# Patient Record
Sex: Female | Born: 1960 | Race: White | Hispanic: No | Marital: Married | Smoking: Never smoker
Health system: Southern US, Community
[De-identification: ages and names within clinical notes are randomized; demographics above are authoritative.]

## PROBLEM LIST (undated history)

## (undated) DIAGNOSIS — E559 Vitamin D deficiency, unspecified: Secondary | ICD-10-CM

## (undated) DIAGNOSIS — Z862 Personal history of diseases of the blood and blood-forming organs and certain disorders involving the immune mechanism: Secondary | ICD-10-CM

## (undated) DIAGNOSIS — K219 Gastro-esophageal reflux disease without esophagitis: Secondary | ICD-10-CM

## (undated) HISTORY — PX: APPENDECTOMY: SHX54

## (undated) HISTORY — DX: Personal history of diseases of the blood and blood-forming organs and certain disorders involving the immune mechanism: Z86.2

## (undated) HISTORY — DX: Gastro-esophageal reflux disease without esophagitis: K21.9

## (undated) HISTORY — DX: Vitamin D deficiency, unspecified: E55.9

---

## 2018-06-06 ENCOUNTER — Encounter: Payer: Self-pay | Admitting: Cardiology

## 2018-06-06 ENCOUNTER — Ambulatory Visit (INDEPENDENT_AMBULATORY_CARE_PROVIDER_SITE_OTHER): Payer: Commercial Managed Care - PPO | Admitting: Cardiology

## 2018-06-06 DIAGNOSIS — R002 Palpitations: Secondary | ICD-10-CM | POA: Insufficient documentation

## 2018-06-06 DIAGNOSIS — R0609 Other forms of dyspnea: Secondary | ICD-10-CM

## 2018-06-06 DIAGNOSIS — I480 Paroxysmal atrial fibrillation: Secondary | ICD-10-CM

## 2018-06-06 DIAGNOSIS — R0600 Dyspnea, unspecified: Secondary | ICD-10-CM

## 2018-06-06 HISTORY — DX: Other forms of dyspnea: R06.09

## 2018-06-06 HISTORY — DX: Palpitations: R00.2

## 2018-06-06 HISTORY — DX: Paroxysmal atrial fibrillation: I48.0

## 2018-06-06 HISTORY — DX: Dyspnea, unspecified: R06.00

## 2018-06-06 MED ORDER — METOPROLOL SUCCINATE ER 50 MG PO TB24: 50.0000 mg | ORAL_TABLET | Freq: Every day | ORAL | 1 refills | Status: DC

## 2018-06-06 NOTE — Patient Instructions (Signed)
Medication Instructions:  Your physician has recommended you make the following change in your medication:  Start: Metoprolol succinate 50 mg daily  If you need a refill on your cardiac medications before your next appointment, please call your pharmacy.   Lab work: None.  If you have labs (blood work) drawn today and your tests are completely normal, you will receive your results only by: Michelle Perkins. MyChart Message (if you have MyChart) OR . A paper copy in the mail If you have any lab test that is abnormal or we need to change your treatment, we will call you to review the results.  Testing/Procedures: Your physician has requested that you have an echocardiogram. Echocardiography is a painless test that uses sound waves to create images of your heart. It provides your doctor with information about the size and shape of your heart and how well your heart's chambers and valves are working. This procedure takes approximately one hour. There are no restrictions for this procedure.    Follow-Up: At [facility], you and your health needs are our priority.  As part of our continuing mission to provide you with exceptional heart care, we have created designated Provider Care Teams.  These Care Teams include your primary Cardiologist (physician) and Advanced Practice Providers (APPs -  Physician Assistants and Nurse Practitioners) who all work together to provide you with the care you need, when you need it. You will need a follow up appointment in 1 months.  Please call our office 2 months in advance to schedule this appointment.  You may see No primary care provider on file. or another member of our [facility] Provider Team in [place]: Norman HerrlichBrian Munley, MD . Belva Cromeajan Revankar, MD  Any Other Special Instructions Will Be Listed Below (If Applicable).  Metoprolol extended-release tablets What is this medicine? METOPROLOL (me TOE proe lole) is a beta-blocker. Beta-blockers reduce the workload on the heart  and help it to beat more regularly. This medicine is used to treat high blood pressure and to prevent chest pain. It is also used to after a heart attack and to prevent an additional heart attack from occurring. This medicine may be used for other purposes; ask your health care provider or pharmacist if you have questions. COMMON BRAND NAME(S): toprol, Toprol XL What should I tell my health care provider before I take this medicine? They need to know if you have any of these conditions: -diabetes -heart or vessel disease like slow heart rate, worsening heart failure, heart block, sick sinus syndrome or Raynaud's disease -kidney disease -liver disease -lung or breathing disease, like asthma or emphysema -pheochromocytoma -thyroid disease -an unusual or allergic reaction to metoprolol, other beta-blockers, medicines, foods, dyes, or preservatives -pregnant or trying to get pregnant -breast-feeding How should I use this medicine? Take this medicine by mouth with a glass of water. Follow the directions on the prescription label. Do not crush or chew. Take this medicine with or immediately after meals. Take your doses at regular intervals. Do not take more medicine than directed. Do not stop taking this medicine suddenly. This could lead to serious heart-related effects. Talk to your pediatrician regarding the use of this medicine in children. While this drug may be prescribed for children as young as 6 years for selected conditions, precautions do apply. Overdosage: If you think you have taken too much of this medicine contact a poison control center or emergency room at once. NOTE: This medicine is only for you. Do not share this medicine  with others. What if I miss a dose? If you miss a dose, take it as soon as you can. If it is almost time for your next dose, take only that dose. Do not take double or extra doses. What may interact with this medicine? This medicine may interact with the  following medications: -certain medicines for blood pressure, heart disease, irregular heart beat -certain medicines for depression, like monoamine oxidase (MAO) inhibitors, fluoxetine, or paroxetine -clonidine -dobutamine -epinephrine -isoproterenol -reserpine This list may not describe all possible interactions. Give your health care provider a list of all the medicines, herbs, non-prescription drugs, or dietary supplements you use. Also tell them if you smoke, drink alcohol, or use illegal drugs. Some items may interact with your medicine. What should I watch for while using this medicine? Visit your doctor or health care professional for regular check ups. Contact your doctor right away if your symptoms worsen. Check your blood pressure and pulse rate regularly. Ask your health care professional what your blood pressure and pulse rate should be, and when you should contact them. You may get drowsy or dizzy. Do not drive, use machinery, or do anything that needs mental alertness until you know how this medicine affects you. Do not sit or stand up quickly, especially if you are an older patient. This reduces the risk of dizzy or fainting spells. Contact your doctor if these symptoms continue. Alcohol may interfere with the effect of this medicine. Avoid alcoholic drinks. What side effects may I notice from receiving this medicine? Side effects that you should report to your doctor or health care professional as soon as possible: -allergic reactions like skin rash, itching or hives -cold or numb hands or feet -depression -difficulty breathing -faint -fever with sore throat -irregular heartbeat, chest pain -rapid weight gain -swollen legs or ankles Side effects that usually do not require medical attention (report to your doctor or health care professional if they continue or are bothersome): -anxiety or nervousness -change in sex drive or performance -dry skin -headache -nightmares or  trouble sleeping -short term memory loss -stomach upset or diarrhea -unusually tired This list may not describe all possible side effects. Call your doctor for medical advice about side effects. You may report side effects to FDA at 1-800-FDA-1088. Where should I keep my medicine? Keep out of the reach of children. Store at room temperature between 15 and 30 degrees C (59 and 86 degrees F). Throw away any unused medicine after the expiration date. NOTE: This sheet is a summary. It may not cover all possible information. If you have questions about this medicine, talk to your doctor, pharmacist, or health care provider.  2019 Elsevier/Gold Standard (2013-01-27 14:41:37)    Echocardiogram An echocardiogram is a procedure that uses painless sound waves (ultrasound) to produce an image of the heart. Images from an echocardiogram can provide important information about:  Signs of coronary artery disease (CAD).  Aneurysm detection. An aneurysm is a weak or damaged part of an artery wall that bulges out from the normal force of blood pumping through the body.  Heart size and shape. Changes in the size or shape of the heart can be associated with certain conditions, including heart failure, aneurysm, and CAD.  Heart muscle function.  Heart valve function.  Signs of a past heart attack.  Fluid buildup around the heart.  Thickening of the heart muscle.  A tumor or infectious growth around the heart valves. Tell a health care provider about:  Any allergies  you have.  All medicines you are taking, including vitamins, herbs, eye drops, creams, and over-the-counter medicines.  Any blood disorders you have.  Any surgeries you have had.  Any medical conditions you have.  Whether you are pregnant or may be pregnant. What are the risks? Generally, this is a safe procedure. However, problems may occur, including:  Allergic reaction to dye (contrast) that may be used during the  procedure. What happens before the procedure? No specific preparation is needed. You may eat and drink normally. What happens during the procedure?   An IV tube may be inserted into one of your veins.  You may receive contrast through this tube. A contrast is an injection that improves the quality of the pictures from your heart.  A gel will be applied to your chest.  A wand-like tool (transducer) will be moved over your chest. The gel will help to transmit the sound waves from the transducer.  The sound waves will harmlessly bounce off of your heart to allow the heart images to be captured in real-time motion. The images will be recorded on a computer. The procedure may vary among health care providers and hospitals. What happens after the procedure?  You may return to your normal, everyday life, including diet, activities, and medicines, unless your health care provider tells you not to do that. Summary  An echocardiogram is a procedure that uses painless sound waves (ultrasound) to produce an image of the heart.  Images from an echocardiogram can provide important information about the size and shape of your heart, heart muscle function, heart valve function, and fluid buildup around your heart.  You do not need to do anything to prepare before this procedure. You may eat and drink normally.  After the echocardiogram is completed, you may return to your normal, everyday life, unless your health care provider tells you not to do that. This information is not intended to replace advice given to you by your health care provider. Make sure you discuss any questions you have with your health care provider. Document Released: 05/22/2000 Document Revised: 06/27/2016 Document Reviewed: 06/27/2016 Elsevier Interactive Patient Education  2019 [employer]

## 2018-06-06 NOTE — Progress Notes (Signed)
Cardiology Consultation:    Date:  06/06/2018   ID:  Michelle Perkins, DOB 1960/12/14, MRN [id number]  PCP:  Philemon KingdomProchnau, Caroline, MD  Cardiologist:  Gypsy Balsamobert Kalisi Bevill, MD   Referring MD: No ref. provider found   Chief Complaint  Patient presents with  . ER follow up  I had atrial fibrillation  History of Present Illness:    Michelle Perkins is a 57 y.o. female who is being seen today for the evaluation of atrial fibrillation at the request of No ref. provider found.  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  Since that time she is been doing okay however last night she said she woke up in the middle of the night with forceful beating of her heart denies having any irregularity she also checked her heart rate and was noted to fast.  Overall she never had any heart trouble but does have multiple family members with history of paroxysmal atrial fibrillation.  Denies have any chest pain tightness squeezing pressure burning chest.  I see her working in PCU and she is quite energetic.  No past medical history on file.    Current Medications: No outpatient medications have been marked as taking for the 06/06/18 encounter (Office Visit) with Georgeanna LeaKrasowski, Indy Kuck J, MD.     Allergies:   Codeine; Levaquin [levofloxacin]; Lidocaine; and Penicillins   Social History   Socioeconomic History  . Marital status: Married    Spouse name: Not on file  . Number of children: Not on file  . Years of education: Not on file  . Highest education level: Not on file    Occupational History  . Not on file  Social Needs  . Financial resource strain: Not on file  . Food insecurity:    Worry: Not on file    Inability: Not on file  . Transportation needs:    Medical: Not on file    Non-medical: Not on file  Tobacco Use  . Smoking status: Never Smoker  . Smokeless tobacco: Never Used  Substance and Sexual Activity  . Alcohol use: Never    Frequency: Never  . Drug use: Never  . Sexual activity: Not on file  Lifestyle  . Physical activity:    Days per week: Not on file    Minutes per session: Not on file  . Stress: Not on file  Relationships  . Social connections:    Talks on phone: Not on file    Gets together: Not on file    Attends religious service: Not on file    Active member of club or organization: Not on file    Attends meetings of clubs or organizations: Not on file    Relationship status: Not on file  Other Topics Concern  . Not on file  Social History Narrative  . Not on file     Family History: The patient's family history includes Atrial fibrillation in her brother, father, mother, and sister; Heart failure in her father; Lung cancer in her mother. ROS:   Please see the history of  present illness.    All 14 point review of systems negative except as described per history of present illness.  EKGs/Labs/Other Studies Reviewed:    The following studies were reviewed today: Atrial fibrillation with a rate of 152.  ST segment depression inferior lateral leads  EKG:  EKG is  ordered today.  The ekg ordered today demonstrates NSR, non specific ST changes  Recent Labs: No results found for requested labs within last 8760 hours.  Recent Lipid Panel No results found for: CHOL, TRIG, HDL, CHOLHDL, VLDL, LDLCALC, LDLDIRECT  Physical Exam:    VS:  BP 140/62   Pulse (!) 102   Ht 5\' 8"  (1.727 m)   Wt 222 lb (100.7 kg)   SpO2 98%   BMI 33.75 kg/m     Wt Readings from Last 3 Encounters:  06/06/18 222 lb (100.7 kg)      GEN:  Well nourished, well developed in no acute distress HEENT: Normal NECK: No JVD; No carotid bruits LYMPHATICS: No lymphadenopathy CARDIAC: RRR, no murmurs, no rubs, no gallops RESPIRATORY:  Clear to auscultation without rales, wheezing or rhonchi  ABDOMEN: Soft, non-tender, non-distended MUSCULOSKELETAL:  No edema; No deformity  SKIN: Warm and dry NEUROLOGIC:  Alert and oriented x 3 PSYCHIATRIC:  Normal affect   ASSESSMENT:    1. Paroxysmal atrial fibrillation (HCC)   2. Dyspnea on exertion   3. Palpitations    PLAN:    In order of problems listed above:  1. Paroxysmal atrial fibrillation.  Chads 2 vascular equals 1.  She is not anticoagulated however she feels her heart forcefully beating today sounds are regular we will do EKG to confirm the rhythm if she is in normal rhythm we will not initiate anticoagulation because of chads 2 vascular equals 1.  I will ask him to have echocardiogram done to assess left ventricular ejection fraction and more importantly left atrial size, I will give her a small dose of beta-blocker try to see if I can suppress her symptoms.  I will also schedule her to have a stress test in spite of the fact that she does not have any symptoms she does have quite significant ST segment changes on EKG and that need to be clarified. 2. Dyspnea on exertion while in atrial fibrillation.  Echocardiogram will be done stress test will be done as well. 3. Palpitations related to atrial fibrillation.  However today she does feel palpitations and her heart rate appears to be regular   Medication Adjustments/Labs and Tests Ordered: Current medicines are reviewed at length with the patient today.  Concerns regarding medicines are outlined above.  No orders of the defined types were placed in this encounter.  No orders of the defined types were placed in this encounter.   Signed, Georgeanna Leaobert J. Lynell Greenhouse, MD, [place] [place]. 06/06/2018 9:33 AM    [facility]

## 2018-06-10 ENCOUNTER — Encounter: Payer: Self-pay | Admitting: Cardiology

## 2018-06-10 DIAGNOSIS — R0609 Other forms of dyspnea: Secondary | ICD-10-CM | POA: Diagnosis not present

## 2018-08-15 ENCOUNTER — Encounter: Payer: Self-pay | Admitting: Cardiology

## 2018-08-15 ENCOUNTER — Ambulatory Visit (INDEPENDENT_AMBULATORY_CARE_PROVIDER_SITE_OTHER): Payer: Commercial Managed Care - PPO | Admitting: Cardiology

## 2018-08-15 VITALS — BP 114/66 | HR 80 | Ht 68.0 in | Wt 224.4 lb

## 2018-08-15 DIAGNOSIS — I480 Paroxysmal atrial fibrillation: Secondary | ICD-10-CM

## 2018-08-15 DIAGNOSIS — R002 Palpitations: Secondary | ICD-10-CM | POA: Diagnosis not present

## 2018-08-15 DIAGNOSIS — R0609 Other forms of dyspnea: Secondary | ICD-10-CM

## 2018-08-15 MED ORDER — APIXABAN 5 MG PO TABS: 5.0000 mg | ORAL_TABLET | ORAL | 0 refills | Status: DC | PRN

## 2018-08-15 MED ORDER — METOPROLOL SUCCINATE ER 25 MG PO TB24: 75.0000 mg | ORAL_TABLET | Freq: Every day | ORAL | 1 refills | Status: DC

## 2018-08-15 MED ORDER — APIXABAN 5 MG PO TABS: 5.0000 mg | ORAL_TABLET | Freq: Two times a day (BID) | ORAL | 0 refills | Status: DC

## 2018-08-15 NOTE — Patient Instructions (Addendum)
Medication Instructions:  Your physician has recommended you make the following change in your medication:  Take as needed for atrial fibrillation lasting longer than 24 hours and contact our office: eliquis 5 mg twice daily   Increase: Metoprolol succinate 75 mg daily   If you need a refill on your cardiac medications before your next appointment, please call your pharmacy.   Lab work: None.  If you have labs (blood work) drawn today and your tests are completely normal, you will receive your results only by: Marland Kitchen MyChart Message (if you have MyChart) OR . A paper copy in the mail If you have any lab test that is abnormal or we need to change your treatment, we will call you to review the results.  Testing/Procedures: None.   Follow-Up: At [facility], you and your health needs are our priority.  As part of our continuing mission to provide you with exceptional heart care, we have created designated Provider Care Teams.  These Care Teams include your primary Cardiologist (physician) and Advanced Practice Providers (APPs -  Physician Assistants and Nurse Practitioners) who all work together to provide you with the care you need, when you need it. You will need a follow up appointment in 3 months.  Please call our office 2 months in advance to schedule this appointment.  You may see No primary care provider on file. or another member of our [facility] Provider Team in [place]: Norman Herrlich, MD . Belva Crome, MD  Any Other Special Instructions Will Be Listed Below (If Applicable).  Apixaban oral tablets What is this medicine? APIXABAN (a PIX a ban) is an anticoagulant (blood thinner). It is used to lower the chance of stroke in people with a medical condition called atrial fibrillation. It is also used to treat or prevent blood clots in the lungs or in the veins. This medicine may be used for other purposes; ask your health care provider or pharmacist if you have  questions. COMMON BRAND NAME(S): Eliquis What should I tell my health care provider before I take this medicine? They need to know if you have any of these conditions: -bleeding disorders -bleeding in the brain -blood in your stools (black or tarry stools) or if you have blood in your vomit -history of stomach bleeding -kidney disease -liver disease -mechanical heart valve -an unusual or allergic reaction to apixaban, other medicines, foods, dyes, or preservatives -pregnant or trying to get pregnant -breast-feeding How should I use this medicine? Take this medicine by mouth with a glass of water. Follow the directions on the prescription label. You can take it with or without food. If it upsets your stomach, take it with food. Take your medicine at regular intervals. Do not take it more often than directed. Do not stop taking except on your doctor's advice. Stopping this medicine may increase your risk of a blood clot. Be sure to refill your prescription before you run out of medicine. Talk to your pediatrician regarding the use of this medicine in children. Special care may be needed. Overdosage: If you think you have taken too much of this medicine contact a poison control center or emergency room at once. NOTE: This medicine is only for you. Do not share this medicine with others. What if I miss a dose? If you miss a dose, take it as soon as you can. If it is almost time for your next dose, take only that dose. Do not take double or extra doses. What may interact  with this medicine? This medicine may interact with the following: -aspirin and aspirin-like medicines -certain medicines for fungal infections like ketoconazole and itraconazole -certain medicines for seizures like carbamazepine and phenytoin -certain medicines that treat or prevent blood clots like warfarin, enoxaparin, and dalteparin -clarithromycin -NSAIDs, medicines for pain and inflammation, like ibuprofen or  naproxen -rifampin -ritonavir -St. John's wort This list may not describe all possible interactions. Give your health care provider a list of all the medicines, herbs, non-prescription drugs, or dietary supplements you use. Also tell them if you smoke, drink alcohol, or use illegal drugs. Some items may interact with your medicine. What should I watch for while using this medicine? Visit your healthcare professional for regular checks on your progress. You may need blood work done while you are taking this medicine. Your condition will be monitored carefully while you are receiving this medicine. It is important not to miss any appointments. Avoid sports and activities that might cause injury while you are using this medicine. Severe falls or injuries can cause unseen bleeding. Be careful when using sharp tools or knives. Consider using an Neurosurgeon. Take special care brushing or flossing your teeth. Report any injuries, bruising, or red spots on the skin to your healthcare professional. If you are going to need surgery or other procedure, tell your healthcare professional that you are taking this medicine. Wear a medical ID bracelet or chain. Carry a card that describes your disease and details of your medicine and dosage times. What side effects may I notice from receiving this medicine? Side effects that you should report to your doctor or health care professional as soon as possible: -allergic reactions like skin rash, itching or hives, swelling of the face, lips, or tongue -signs and symptoms of bleeding such as bloody or black, tarry stools; red or dark-brown urine; spitting up blood or brown material that looks like coffee grounds; red spots on the skin; unusual bruising or bleeding from the eye, gums, or nose -signs and symptoms of a blood clot such as chest pain; shortness of breath; pain, swelling, or warmth in the leg -signs and symptoms of a stroke such as changes in vision;  confusion; trouble speaking or understanding; severe headaches; sudden numbness or weakness of the face, arm or leg; trouble walking; dizziness; loss of coordination This list may not describe all possible side effects. Call your doctor for medical advice about side effects. You may report side effects to FDA at 1-800-FDA-1088. Where should I keep my medicine? Keep out of the reach of children. Store at room temperature between 20 and 25 degrees C (68 and 77 degrees F). Throw away any unused medicine after the expiration date. NOTE: This sheet is a summary. It may not cover all possible information. If you have questions about this medicine, talk to your doctor, pharmacist, or health care provider.  2019 Elsevier/Gold Standard (2017-05-20 11:20:07)

## 2018-08-15 NOTE — Progress Notes (Signed)
Cardiology Office Note:    Date:  08/15/2018   ID:  Michelle Perkins, DOB 1960-10-29, MRN [id number]  PCP:  Philemon Kingdom, MD  Cardiologist:  Gypsy Balsam, MD    Referring MD: Philemon Kingdom, MD   Chief Complaint  Patient presents with  . Atrial Fibrillation  I had episode of atrial fibrillation over the weekend  History of Present Illness:    Michelle Perkins is a 58 y.o. female with paroxysmal atrial fibrillation so far she got one documented episode of atrial fibrillation which happened in December she called me over the weekend and described episode of palpitations again she took extra dose of Cardizem and eventually converted to sinus rhythm since that time she is doing well she came today to talk about that overall she is active and she does a lot of things have no difficulty no limitations her thyroid was normal previously she does not have sleep apnea she does not drink alcohol.  She does have multiple family members with episode of atrial fibrillation we talked about options in this situation I advised to increase dose of Toprol-XL she takes 50 mg to 75 mg she will use Cardizem short acting on as needed basis for paroxysmal atrial fibrillation she is not anticoagulated her chads 2 Vascor equals 1.  I will however give her samples of Eliquis 5 mg twice daily with instruction to take it only if episode of atrial fibrillation last more than 24 hours I did review her echocardiogram from hospital which showed normal left atrial size and normal left ventricular ejection fraction.  We talked about the need to exercise weight management which can helps with preventing episode of atrial fibrillation.  Also told her if she will have more recurrences of atrial fibrillation then antiarrhythmic therapy need to be started we also briefly discussed the issue of atrial fibrillation ablation.  No past medical history on file.    Current Medications: Current Meds  Medication Sig  . diltiazem  (CARDIZEM) 30 MG tablet Take 30 mg by mouth 3 (three) times daily. If heart rate is over 120  . metoprolol succinate (TOPROL-XL) 50 MG 24 hr tablet Take 1 tablet (50 mg total) by mouth daily. Take with or immediately following a meal.     Allergies:   Codeine; Levaquin [levofloxacin]; Lidocaine; and Penicillins   Social History   Socioeconomic History  . Marital status: Married    Spouse name: Not on file  . Number of children: Not on file  . Years of education: Not on file  . Highest education level: Not on file  Occupational History  . Not on file  Social Needs  . Financial resource strain: Not on file  . Food insecurity:    Worry: Not on file    Inability: Not on file  . Transportation needs:    Medical: Not on file    Non-medical: Not on file  Tobacco Use  . Smoking status: Never Smoker  . Smokeless tobacco: Never Used  Substance and Sexual Activity  . Alcohol use: Never    Frequency: Never  . Drug use: Never  . Sexual activity: Not on file  Lifestyle  . Physical activity:    Days per week: Not on file    Minutes per session: Not on file  . Stress: Not on file  Relationships  . Social connections:    Talks on phone: Not on file    Gets together: Not on file    Attends religious service:  Not on file    Active member of club or organization: Not on file    Attends meetings of clubs or organizations: Not on file    Relationship status: Not on file  Other Topics Concern  . Not on file  Social History Narrative  . Not on file     Family History: The patient's family history includes Atrial fibrillation in her brother, father, mother, and sister; Heart failure in her father; Lung cancer in her mother. ROS:   Please see the history of present illness.    All 14 point review of systems negative except as described per history of present illness  EKGs/Labs/Other Studies Reviewed:    Normal sinus rhythm normal P interval normal QS complex duration morphology no  ST-T segment changes  Recent Labs: No results found for requested labs within last 8760 hours.  Recent Lipid Panel No results found for: CHOL, TRIG, HDL, CHOLHDL, VLDL, LDLCALC, LDLDIRECT  Physical Exam:    VS:  BP 114/66   Pulse 80   Ht 5\' 8"  (1.727 m)   Wt 224 lb 6.4 oz (101.8 kg)   SpO2 96%   BMI 34.12 kg/m     Wt Readings from Last 3 Encounters:  08/15/18 224 lb 6.4 oz (101.8 kg)  06/06/18 222 lb (100.7 kg)     GEN:  Well nourished, well developed in no acute distress HEENT: Normal NECK: No JVD; No carotid bruits LYMPHATICS: No lymphadenopathy CARDIAC: RRR, no murmurs, no rubs, no gallops RESPIRATORY:  Clear to auscultation without rales, wheezing or rhonchi  ABDOMEN: Soft, non-tender, non-distended MUSCULOSKELETAL:  No edema; No deformity  SKIN: Warm and dry LOWER EXTREMITIES: no swelling NEUROLOGIC:  Alert and oriented x 3 PSYCHIATRIC:  Normal affect   ASSESSMENT:    1. Paroxysmal atrial fibrillation (HCC)   2. Palpitations   3. Dyspnea on exertion    PLAN:    In order of problems listed above:  1. Paroxysmal atrial fibrillation will increase dose of Toprol-XL I will give her samples of Eliquis with instruction to take it only for have episode of atrial fibrillation lasting more than 24 hours 2. Palpitations look like she have recurrences of atrial fibrillation, I advised her to get apple watch and record arrhythmia if she got palpitations. 3. Is been on exertion weight management has been discussed.  Echocardiogram showed preserved ejection fraction   Medication Adjustments/Labs and Tests Ordered: Current medicines are reviewed at length with the patient today.  Concerns regarding medicines are outlined above.  No orders of the defined types were placed in this encounter.  Medication changes: No orders of the defined types were placed in this encounter.   Signed, [name], MD, [facility] 08/15/2018 11:00 AM    [facility]

## 2018-11-15 ENCOUNTER — Telehealth: Payer: Commercial Managed Care - PPO | Admitting: Cardiology

## 2018-12-13 ENCOUNTER — Telehealth: Payer: Self-pay | Admitting: *Deleted

## 2018-12-13 NOTE — Telephone Encounter (Signed)
Pt is experiencing dizziness and a-fib. Has questions about medication rxd Diltiazem. Pt wanted to see Dr. Agustin Cree but he is booked out to September. Please advise.

## 2018-12-13 NOTE — Telephone Encounter (Signed)
Having AF issues with rates in the 70-80's. Feeling bad . Wants to see Dr. Agustin Cree. Appointment made for 12/14/2018 at 4:15PM.

## 2018-12-14 ENCOUNTER — Other Ambulatory Visit: Payer: Self-pay

## 2018-12-14 ENCOUNTER — Telehealth (INDEPENDENT_AMBULATORY_CARE_PROVIDER_SITE_OTHER): Payer: Commercial Managed Care - PPO | Admitting: Cardiology

## 2018-12-14 DIAGNOSIS — I480 Paroxysmal atrial fibrillation: Secondary | ICD-10-CM

## 2018-12-14 DIAGNOSIS — R0609 Other forms of dyspnea: Secondary | ICD-10-CM

## 2018-12-14 DIAGNOSIS — R002 Palpitations: Secondary | ICD-10-CM | POA: Diagnosis not present

## 2018-12-14 NOTE — Progress Notes (Signed)
Cardiology Office Note:    Date:  12/14/2018   ID:  Michelle Perkins, DOB January 30, 1961, MRN [id number]  PCP:  Ernestene Kiel, MD  Cardiologist:  Jenne Campus, MD    Referring MD: Ernestene Kiel, MD   No chief complaint on file. I have palpitations  History of Present Illness:    Michelle Perkins is a 58 y.o. female with paroxysmal atrial fibrillation.  Recently she started having more palpitations and those appears to be lasting longer.  She is taking baseline beta-blocker and the use of Cardizem on as-needed basis she try to do it with limited success.  Within last 3 days she had at least 2 episodes of what appears to be atrial fibrillation.  I think the key will be trying to determine if it is truly atrial fibrillation.  Therefore I talked to her again about getting device that will allow her to record EKG herself.  We will also send to her 0 patch so we can see how much of atrial fibrillation she has asked her to increase dose of Toprol-XL 100 mg daily.  I also told her if she have another episodes to start using Eliquis.  I am afraid we reached the point that antiarrhythmic should be considered but we need to clarify exactly diagnosis.  No past medical history on file.    Current Medications: No outpatient medications have been marked as taking for the 12/14/18 encounter (Telemedicine) with Park Liter, MD.     Allergies:   Codeine, Levaquin [levofloxacin], Lidocaine, and Penicillins   Social History   Socioeconomic History  . Marital status: Married    Spouse name: Not on file  . Number of children: Not on file  . Years of education: Not on file  . Highest education level: Not on file  Occupational History  . Not on file  Social Needs  . Financial resource strain: Not on file  . Food insecurity    Worry: Not on file    Inability: Not on file  . Transportation needs    Medical: Not on file    Non-medical: Not on file  Tobacco Use  . Smoking status: Never  Smoker  . Smokeless tobacco: Never Used  Substance and Sexual Activity  . Alcohol use: Never    Frequency: Never  . Drug use: Never  . Sexual activity: Not on file  Lifestyle  . Physical activity    Days per week: Not on file    Minutes per session: Not on file  . Stress: Not on file  Relationships  . Social Herbalist on phone: Not on file    Gets together: Not on file    Attends religious service: Not on file    Active member of club or organization: Not on file    Attends meetings of clubs or organizations: Not on file    Relationship status: Not on file  Other Topics Concern  . Not on file  Social History Narrative  . Not on file     Family History: The patient's family history includes Atrial fibrillation in her brother, father, mother, and sister; Heart failure in her father; Lung cancer in her mother. ROS:   Please see the history of present illness.    All 14 point review of systems negative except as described per history of present illness  EKGs/Labs/Other Studies Reviewed:      Recent Labs: No results found for requested labs within last 8760 hours.  Recent Lipid Panel No results found for: CHOL, TRIG, HDL, CHOLHDL, VLDL, LDLCALC, LDLDIRECT  Physical Exam:    VS:  There were no vitals taken for this visit.    Wt Readings from Last 3 Encounters:  08/15/18 224 lb 6.4 oz (101.8 kg)  06/06/18 222 lb (100.7 kg)     GEN:  Well nourished, well developed in no acute distress HEENT: Normal NECK: No JVD; No carotid bruits LYMPHATICS: No lymphadenopathy CARDIAC: RRR, no murmurs, no rubs, no gallops RESPIRATORY:  Clear to auscultation without rales, wheezing or rhonchi  ABDOMEN: Soft, non-tender, non-distended MUSCULOSKELETAL:  No edema; No deformity  SKIN: Warm and dry LOWER EXTREMITIES: no swelling NEUROLOGIC:  Alert and oriented x 3 PSYCHIATRIC:  Normal affect   ASSESSMENT:    1. Paroxysmal atrial fibrillation (HCC)   2. Palpitations   3.  Dyspnea on exertion    PLAN:    In order of problems listed above:  1. Paroxysmal atrial fibrillation plan as outlined above 2. Palpitations we will put monitor to make sure that this is atrial fibrillation 3. Dyspnea and exertion echocardiogram reviewed which was normal   Medication Adjustments/Labs and Tests Ordered: Current medicines are reviewed at length with the patient today.  Concerns regarding medicines are outlined above.  No orders of the defined types were placed in this encounter.  Medication changes: No orders of the defined types were placed in this encounter.   Signed, Georgeanna Leaobert J. Daylin Gruszka, MD, [facility] 12/14/2018 4:45 PM    [facility]

## 2018-12-14 NOTE — Patient Instructions (Signed)
Medication Instructions:  Your physician recommends that you continue on your current medications as directed. Please refer to the Current Medication list given to you today.  If you need a refill on your cardiac medications before your next appointment, please call your pharmacy.   Lab work:None If you have labs (blood work) drawn today and your tests are completely normal, you will receive your results only by: Marland Kitchen MyChart Message (if you have MyChart) OR . A paper copy in the mail If you have any lab test that is abnormal or we need to change your treatment, we will call you to review the results.  Testing/Procedures: Your physician has recommended that you wear a ZIO monitor. ZIO monitors are medical devices that record the heart's electrical activity. Doctors most often use these monitors to diagnose arrhythmias. Arrhythmias are problems with the speed or rhythm of the heartbeat. The monitor is a small, portable device. You can wear one while you do your normal daily activities. This is usually used to diagnose what is causing palpitations/syncope (passing out).  Wear 7 days  Follow-Up: At [facility], you and your health needs are our priority.  As part of our continuing mission to provide you with exceptional heart care, we have created designated Provider Care Teams.  These Care Teams include your primary Cardiologist (physician) and Advanced Practice Providers (APPs -  Physician Assistants and Nurse Practitioners) who all work together to provide you with the care you need, when you need it. You will need a follow up appointment in 2 weeks.   Any Other Special Instructions Will Be Listed Below (If Applicable).

## 2018-12-20 ENCOUNTER — Other Ambulatory Visit (INDEPENDENT_AMBULATORY_CARE_PROVIDER_SITE_OTHER): Payer: Commercial Managed Care - PPO

## 2018-12-20 DIAGNOSIS — R002 Palpitations: Secondary | ICD-10-CM | POA: Diagnosis not present

## 2018-12-20 DIAGNOSIS — I480 Paroxysmal atrial fibrillation: Secondary | ICD-10-CM

## 2018-12-27 ENCOUNTER — Encounter: Payer: Self-pay | Admitting: Cardiology

## 2018-12-27 ENCOUNTER — Other Ambulatory Visit: Payer: Self-pay

## 2018-12-27 ENCOUNTER — Ambulatory Visit (INDEPENDENT_AMBULATORY_CARE_PROVIDER_SITE_OTHER): Payer: Commercial Managed Care - PPO | Admitting: Cardiology

## 2018-12-27 VITALS — BP 120/74 | HR 76 | Ht 68.0 in | Wt 231.6 lb

## 2018-12-27 DIAGNOSIS — R002 Palpitations: Secondary | ICD-10-CM

## 2018-12-27 DIAGNOSIS — I480 Paroxysmal atrial fibrillation: Secondary | ICD-10-CM | POA: Diagnosis not present

## 2018-12-27 DIAGNOSIS — R0609 Other forms of dyspnea: Secondary | ICD-10-CM

## 2018-12-27 MED ORDER — METOPROLOL SUCCINATE ER 100 MG PO TB24: 100.0000 mg | ORAL_TABLET | Freq: Every day | ORAL | 1 refills | Status: DC

## 2018-12-27 NOTE — Progress Notes (Signed)
Cardiology Office Note:    Date:  12/27/2018   ID:  Michelle DistanceLois B Lites, DOB 05/08/1961, MRN [id number]  PCP:  Philemon KingdomProchnau, Caroline, MD  Cardiologist:  Gypsy Balsamobert Krasowski, MD    Referring MD: Philemon KingdomProchnau, Caroline, MD   Chief Complaint  Patient presents with  . 2 Week Follow-up  Doing better  History of Present Illness:    Michelle Perkins is a 58 y.o. female with paroxysmal atrial fibrillation 2 weeks ago I saw her after she was complaining of having more palpitations.  To increase dose of beta-blocker.  She also wore monitor today she comes to talk about it overall she is doing better with higher dose of metoprolol.  Described to have some forceful pounding in her chest when she lay down at night.  She purchase cardia and she recorded few recordings which showing sinus rhythm.  Overall she is feeling better.  No past medical history on file.    Current Medications: Current Meds  Medication Sig  . apixaban (ELIQUIS) 5 MG TABS tablet Take 1 tablet (5 mg total) by mouth as needed (as needed for atrial firillation longer than 24 hours. 5 mg twice daily).  Marland Kitchen. diltiazem (CARDIZEM) 30 MG tablet Take 30 mg by mouth 3 (three) times daily. If heart rate is over 120  . metoprolol succinate (TOPROL-XL) 25 MG 24 hr tablet Take 3 tablets (75 mg total) by mouth daily. Take with or immediately following a meal. (Patient taking differently: Take 100 mg by mouth daily. Take with or immediately following a meal.)     Allergies:   Codeine, Levaquin [levofloxacin], Lidocaine, and Penicillins   Social History   Socioeconomic History  . Marital status: Married    Spouse name: Not on file  . Number of children: Not on file  . Years of education: Not on file  . Highest education level: Not on file  Occupational History  . Not on file  Social Needs  . Financial resource strain: Not on file  . Food insecurity    Worry: Not on file    Inability: Not on file  . Transportation needs    Medical: Not on file   Non-medical: Not on file  Tobacco Use  . Smoking status: Never Smoker  . Smokeless tobacco: Never Used  Substance and Sexual Activity  . Alcohol use: Never    Frequency: Never  . Drug use: Never  . Sexual activity: Not on file  Lifestyle  . Physical activity    Days per week: Not on file    Minutes per session: Not on file  . Stress: Not on file  Relationships  . Social Musicianconnections    Talks on phone: Not on file    Gets together: Not on file    Attends religious service: Not on file    Active member of club or organization: Not on file    Attends meetings of clubs or organizations: Not on file    Relationship status: Not on file  Other Topics Concern  . Not on file  Social History Narrative  . Not on file     Family History: The patient's family history includes Atrial fibrillation in her brother, father, mother, and sister; Heart failure in her father; Lung cancer in her mother. ROS:   Please see the history of present illness.    All 14 point review of systems negative except as described per history of present illness  EKGs/Labs/Other Studies Reviewed:      Recent Labs:  No results found for requested labs within last 8760 hours.  Recent Lipid Panel No results found for: CHOL, TRIG, HDL, CHOLHDL, VLDL, LDLCALC, LDLDIRECT  Physical Exam:    VS:  BP 120/74   Pulse 76   Ht 5\' 8"  (1.727 m)   Wt 231 lb 9.6 oz (105.1 kg)   SpO2 98%   BMI 35.21 kg/m     Wt Readings from Last 3 Encounters:  12/27/18 231 lb 9.6 oz (105.1 kg)  08/15/18 224 lb 6.4 oz (101.8 kg)  06/06/18 222 lb (100.7 kg)     GEN:  Well nourished, well developed in no acute distress HEENT: Normal NECK: No JVD; No carotid bruits LYMPHATICS: No lymphadenopathy CARDIAC: RRR, no murmurs, no rubs, no gallops RESPIRATORY:  Clear to auscultation without rales, wheezing or rhonchi  ABDOMEN: Soft, non-tender, non-distended MUSCULOSKELETAL:  No edema; No deformity  SKIN: Warm and dry LOWER  EXTREMITIES: no swelling NEUROLOGIC:  Alert and oriented x 3 PSYCHIATRIC:  Normal affect   ASSESSMENT:    1. Paroxysmal atrial fibrillation (HCC)   2. Palpitations   3. Dyspnea on exertion    PLAN:    In order of problems listed above:  1. Paroxysmal atrial fibrillation, chads 2 Vascor equals 1.  Not anticoagulated.  On calcium channel blocker as well as beta-blocker.  She did wear monitor awaiting results of it.  She is getting ready to send it back to [place] today. 2. Palpitations improved significantly continue higher dose of beta-blocker 3. Dyspnea on exertion echocardiogram was normal encouraged her to exercise on a regular basis also told her from atrial fibrillation point review weight loss and exercises on the regular basis can be beneficial.  And encouraged her to exercise   Medication Adjustments/Labs and Tests Ordered: Current medicines are reviewed at length with the patient today.  Concerns regarding medicines are outlined above.  No orders of the defined types were placed in this encounter.  Medication changes: No orders of the defined types were placed in this encounter.   Signed, Park Liter, MD, [facility] 12/27/2018 11:06 AM    [place]

## 2018-12-27 NOTE — Addendum Note (Signed)
Addended by: Ashok Norris on: 12/27/2018 11:14 AM   Modules accepted: Orders

## 2018-12-27 NOTE — Patient Instructions (Signed)
Medication Instructions:  Your physician has recommended you make the following change in your medication:   Increase: Metoprolol succinate to 100 mg daily   If you need a refill on your cardiac medications before your next appointment, please call your pharmacy.   Lab work: None.  If you have labs (blood work) drawn today and your tests are completely normal, you will receive your results only by: Marland Kitchen MyChart Message (if you have MyChart) OR . A paper copy in the mail If you have any lab test that is abnormal or we need to change your treatment, we will call you to review the results.  Testing/Procedures: None.   Follow-Up: At [facility], you and your health needs are our priority.  As part of our continuing mission to provide you with exceptional heart care, we have created designated Provider Care Teams.  These Care Teams include your primary Cardiologist (physician) and Advanced Practice Providers (APPs -  Physician Assistants and Nurse Practitioners) who all work together to provide you with the care you need, when you need it. You will need a follow up appointment in 3 months.  Please call our office 2 months in advance to schedule this appointment.  You may see No primary care provider on file. or another member of our [employer] Provider Team in [place]: Shirlee More, MD . Jyl Heinz, MD  Any Other Special Instructions Will Be Listed Below (If Applicable).

## 2019-03-02 ENCOUNTER — Other Ambulatory Visit: Payer: Self-pay | Admitting: Cardiology

## 2019-03-02 MED ORDER — METOPROLOL SUCCINATE ER 25 MG PO TB24: 75.0000 mg | ORAL_TABLET | Freq: Every day | ORAL | 0 refills | Status: DC

## 2019-03-02 NOTE — Telephone Encounter (Signed)
°*  STAT* If patient is at the pharmacy, call can be transferred to refill team.   1. Which medications need to be refilled? (please list name of each medication and dose if known) Metoprolol it has been changed to 75 mg exteneded release   2. Which pharmacy/location (including street and city if local pharmacy) is medication to be sent to? [facility]  3. Do they need a 30 day or 90 day supply? [place]

## 2019-03-02 NOTE — Addendum Note (Signed)
Addended by: Stevan Born on: 03/02/2019 04:47 PM   Modules accepted: Orders

## 2019-03-02 NOTE — Telephone Encounter (Signed)
Spoke with patient to verify which dose of metoprolol succinate she is currently taking.  Patient is a Marine scientist at [employer] and states that she spoke with Dr Agustin Cree in the hallway, and he reduced metoprolol succinate to 75mg  daily.  Per verbal order from Dr Agustin Cree, [place] to refill metoprolol succinate 25mg  three tablets daily. Rx sent to pharmacy.

## 2019-04-07 ENCOUNTER — Encounter: Payer: Self-pay | Admitting: Cardiology

## 2019-04-07 ENCOUNTER — Telehealth (INDEPENDENT_AMBULATORY_CARE_PROVIDER_SITE_OTHER): Payer: Commercial Managed Care - PPO | Admitting: Cardiology

## 2019-04-07 ENCOUNTER — Other Ambulatory Visit: Payer: Self-pay

## 2019-04-07 VITALS — HR 98

## 2019-04-07 DIAGNOSIS — I480 Paroxysmal atrial fibrillation: Secondary | ICD-10-CM

## 2019-04-07 DIAGNOSIS — R002 Palpitations: Secondary | ICD-10-CM

## 2019-04-07 DIAGNOSIS — R0609 Other forms of dyspnea: Secondary | ICD-10-CM

## 2019-04-07 MED ORDER — APIXABAN 5 MG PO TABS: 5.0000 mg | ORAL_TABLET | ORAL | 0 refills | Status: DC | PRN

## 2019-04-07 MED ORDER — METOPROLOL SUCCINATE ER 25 MG PO TB24: 75.0000 mg | ORAL_TABLET | Freq: Every day | ORAL | 1 refills | Status: DC

## 2019-04-07 NOTE — Addendum Note (Signed)
Addended by: Ashok Norris on: 04/07/2019 10:41 AM   Modules accepted: Orders

## 2019-04-07 NOTE — Progress Notes (Signed)
Virtual Visit via Video Note   This visit type was conducted due to national recommendations for restrictions regarding the COVID-19 Pandemic (e.g. social distancing) in an effort to limit this patient's exposure and mitigate transmission in our community.  Due to her co-morbid illnesses, this patient is at least at moderate risk for complications without adequate follow up.  This format is felt to be most appropriate for this patient at this time.  All issues noted in this document were discussed and addressed.  A limited physical exam was performed with this format.  Please refer to the patient's chart for her consent to telehealth for [facility]    Virtual Visit via Video Note   This visit type was conducted due to national recommendations for restrictions regarding the COVID-19 Pandemic (e.g. social distancing) in an effort to limit this patient's exposure and mitigate transmission in our community.  Due to her co-morbid illnesses, this patient is at least at moderate risk for complications without adequate follow up.  This format is felt to be most appropriate for this patient at this time.  All issues noted in this document were discussed and addressed.  A limited physical exam was performed with this format.  Please refer to the patient's chart for her consent to telehealth for [facility].  Evaluation Performed:  Follow-up visit  This visit type was conducted due to national recommendations for restrictions regarding the COVID-19 Pandemic (e.g. social distancing).  This format is felt to be most appropriate for this patient at this time.  All issues noted in this document were discussed and addressed.  No physical exam was performed (except for noted visual exam findings with Video Visits).  Please refer to the patient's chart (MyChart message for video visits and phone note for telephone visits) for the patient's consent to telehealth for [facility].  Date:  04/07/2019  ID:  Michelle Perkins, DOB 1960-08-05, MRN [id number]   Patient Location: [address] [place] [address]   Provider location:   [facility] [place] [facility]  PCP:  Philemon Kingdom, MD  Cardiologist:  Gypsy Balsam, MD     Chief Complaint: I have palpitations  History of Present Illness:    Michelle Perkins is a 58 y.o. female  who presents via audio/video conferencing for a telehealth visit today.  With paroxysmal atrial fibrillation.  She requested to be seen today because she woke up in the morning with palpitations she does have cardia device she recorded the rhythm strip which showed atrial fibrillation.  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  Denies having any chest pain, tightness, pressure, burning in the chest.  I asked her to record her rhythm strip and send it to me.   The patient does not have symptoms concerning for COVID-19 infection (fever, chills, cough, or new SHORTNESS OF BREATH).    Prior CV studies:   The following studies were reviewed today:       No past medical history on file.     Current Meds  Medication Sig  . apixaban (ELIQUIS) 5 MG TABS tablet Take 1 tablet (5 mg total) by mouth as needed (as needed for atrial firillation longer than 24 hours. 5 mg twice daily).  Marland Kitchen diltiazem (CARDIZEM) 30 MG tablet Take 30 mg by mouth 3 (three) times daily. If heart rate is over 120  .  metoprolol succinate (TOPROL-XL) 25 MG 24 hr tablet Take 3 tablets (75 mg total) by mouth daily.      Family History: The patient's family history includes Atrial fibrillation in her brother, father, mother, and sister; Heart failure in her father; Lung cancer in her mother.   ROS:   Please see the history of present illness.     All other systems reviewed and are negative.   Labs/Other Tests and Data Reviewed:     Recent Labs: No results found for  requested labs within last 8760 hours.  Recent Lipid Panel No results found for: CHOL, TRIG, HDL, CHOLHDL, VLDL, LDLCALC, LDLDIRECT    Exam:    Vital Signs:  Pulse 98     Wt Readings from Last 3 Encounters:  12/27/18 231 lb 9.6 oz (105.1 kg)  08/15/18 224 lb 6.4 oz (101.8 kg)  06/06/18 222 lb (100.7 kg)     Well nourished, well developed in no acute distress. 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  Not in any distress  Diagnosis for this visit:   1. Paroxysmal atrial fibrillation (HCC)   2. Dyspnea on exertion   3. Palpitations      ASSESSMENT & PLAN:    1.  Paroxysmal atrial fibrillation looks like she is in atrial fibrillation right now I told her of episode of atrial fibrillation last more than 24 hours she need to be anticoagulated.  I also asked her to record her rhythm strips and pelvis how she is doing. Dyspnea on exertion denies having any echocardiogram showed preserved ejection fraction. 3.  Palpitations she wear event recorder which showed only supraventricular tachycardia.  Again plan is to record her EKG today  COVID-19 Education: The signs and symptoms of COVID-19 were discussed with the patient and how to seek care for testing (follow up with PCP or arrange E-visit).  The importance of social distancing was discussed today.  Patient Risk:   After full review of this patients clinical status, I feel that they are at least moderate risk at this time.  Time:   Today, I have spent 5 minutes with the patient with telehealth technology discussing pt health issues.  I spent 20 minutes reviewing her chart before the visit.  Visit was finished at 10:26 AM.    Medication Adjustments/Labs and Tests Ordered: Current medicines are reviewed at length with the patient today.  Concerns regarding medicines are outlined above.  No orders of the defined types were placed in this  encounter.  Medication changes: No orders of the defined types were placed in this encounter. I just did receive the rhythm strips that she sent me from cardia and truly she is in atrial fibrillation with controlled ventricular rate.  Plan is if she does not convert by tomorrow morning she need to start Eliquis and be on Eliquis for at least 4 weeks after she converts we may be forced to convert her electrically.  Disposition: Follow-up 3 months  Signed, Park Liter, MD, [facility] 04/07/2019 10:21 AM    [place]

## 2019-04-07 NOTE — Patient Instructions (Addendum)
Medication Instructions:  Your physician recommends that you continue on your current medications as directed. Please refer to the Current Medication list given to you today.   ** IF you are in AFIB for more than 24 hours start eliquis 5 mg twice daily for 3-4 weeks (call our office if you start this)   *If you need a refill on your cardiac medications before your next appointment, please call your pharmacy*  Lab Work: None.  If you have labs (blood work) drawn today and your tests are completely normal, you will receive your results only by: Marland Kitchen MyChart Message (if you have MyChart) OR . A paper copy in the mail If you have any lab test that is abnormal or we need to change your treatment, we will call you to review the results.  Testing/Procedures: None.   Follow-Up: At [facility], you and your health needs are our priority.  As part of our continuing mission to provide you with exceptional heart care, we have created designated Provider Care Teams.  These Care Teams include your primary Cardiologist (physician) and Advanced Practice Providers (APPs -  Physician Assistants and Nurse Practitioners) who all work together to provide you with the care you need, when you need it.  Your next appointment:   3 months  The format for your next appointment:   In Person  Provider:   You will see Dr. Bing Matter.  Or, you can be scheduled with the following Advanced Practice Provider on your designated Care Team (at our [facility]):  Gillian Shields, FNP    Other Instructions  Apixaban oral tablets What is this medicine? APIXABAN (a PIX a ban) is an anticoagulant (blood thinner). It is used to lower the chance of stroke in people with a medical condition called atrial fibrillation. It is also used to treat or prevent blood clots in the lungs or in the veins. This medicine may be used for other purposes; ask your health care provider or pharmacist if you have questions. COMMON  BRAND NAME(S): Eliquis What should I tell my health care provider before I take this medicine? They need to know if you have any of these conditions:  antiphospholipid antibody syndrome  bleeding disorders  bleeding in the brain  blood in your stools (black or tarry stools) or if you have blood in your vomit  history of blood clots  history of stomach bleeding  kidney disease  liver disease  mechanical heart valve  an unusual or allergic reaction to apixaban, other medicines, foods, dyes, or preservatives  pregnant or trying to get pregnant  breast-feeding How should I use this medicine? Take this medicine by mouth with a glass of water. Follow the directions on the prescription label. You can take it with or without food. If it upsets your stomach, take it with food. Take your medicine at regular intervals. Do not take it more often than directed. Do not stop taking except on your doctor's advice. Stopping this medicine may increase your risk of a blood clot. Be sure to refill your prescription before you run out of medicine. Talk to your pediatrician regarding the use of this medicine in children. Special care may be needed. Overdosage: If you think you have taken too much of this medicine contact a poison control center or emergency room at once. NOTE: This medicine is only for you. Do not share this medicine with others. What if I miss a dose? If you miss a dose, take it as  soon as you can. If it is almost time for your next dose, take only that dose. Do not take double or extra doses. What may interact with this medicine? This medicine may interact with the following:  aspirin and aspirin-like medicines  certain medicines for fungal infections like ketoconazole and itraconazole  certain medicines for seizures like carbamazepine and phenytoin  certain medicines that treat or prevent blood clots like warfarin, enoxaparin, and dalteparin  clarithromycin  NSAIDs,  medicines for pain and inflammation, like ibuprofen or naproxen  rifampin  ritonavir  St. John's wort This list may not describe all possible interactions. Give your health care provider a list of all the medicines, herbs, non-prescription drugs, or dietary supplements you use. Also tell them if you smoke, drink alcohol, or use illegal drugs. Some items may interact with your medicine. What should I watch for while using this medicine? Visit your healthcare professional for regular checks on your progress. You may need blood work done while you are taking this medicine. Your condition will be monitored carefully while you are receiving this medicine. It is important not to miss any appointments. Avoid sports and activities that might cause injury while you are using this medicine. Severe falls or injuries can cause unseen bleeding. Be careful when using sharp tools or knives. Consider using an Copy. Take special care brushing or flossing your teeth. Report any injuries, bruising, or red spots on the skin to your healthcare professional. If you are going to need surgery or other procedure, tell your healthcare professional that you are taking this medicine. Wear a medical ID bracelet or chain. Carry a card that describes your disease and details of your medicine and dosage times. What side effects may I notice from receiving this medicine? Side effects that you should report to your doctor or health care professional as soon as possible:  allergic reactions like skin rash, itching or hives, swelling of the face, lips, or tongue  signs and symptoms of bleeding such as bloody or black, tarry stools; red or dark-brown urine; spitting up blood or brown material that looks like coffee grounds; red spots on the skin; unusual bruising or bleeding from the eye, gums, or nose  signs and symptoms of a blood clot such as chest pain; shortness of breath; pain, swelling, or warmth in the leg  signs  and symptoms of a stroke such as changes in vision; confusion; trouble speaking or understanding; severe headaches; sudden numbness or weakness of the face, arm or leg; trouble walking; dizziness; loss of coordination This list may not describe all possible side effects. Call your doctor for medical advice about side effects. You may report side effects to FDA at 1-800-FDA-1088. Where should I keep my medicine? Keep out of the reach of children. Store at room temperature between 20 and 25 degrees C (68 and 77 degrees F). Throw away any unused medicine after the expiration date. NOTE: This sheet is a summary. It may not cover all possible information. If you have questions about this medicine, talk to your doctor, pharmacist, or health care provider.  2020 Elsevier/Gold Standard (2018-02-02 17:39:34)

## 2019-06-05 ENCOUNTER — Other Ambulatory Visit: Payer: Self-pay

## 2019-06-05 ENCOUNTER — Encounter: Payer: Self-pay | Admitting: Cardiology

## 2019-06-05 ENCOUNTER — Telehealth (INDEPENDENT_AMBULATORY_CARE_PROVIDER_SITE_OTHER): Payer: Commercial Managed Care - PPO | Admitting: Cardiology

## 2019-06-05 VITALS — Ht 68.0 in | Wt 220.0 lb

## 2019-06-05 DIAGNOSIS — I480 Paroxysmal atrial fibrillation: Secondary | ICD-10-CM | POA: Diagnosis not present

## 2019-06-05 DIAGNOSIS — R002 Palpitations: Secondary | ICD-10-CM

## 2019-06-05 DIAGNOSIS — R0609 Other forms of dyspnea: Secondary | ICD-10-CM

## 2019-06-05 MED ORDER — DILTIAZEM HCL ER COATED BEADS 180 MG PO CP24: 180.0000 mg | ORAL_CAPSULE | Freq: Every day | ORAL | 3 refills | Status: DC

## 2019-06-05 NOTE — Patient Instructions (Signed)
Medication Instructions:  START Eliquis 5 mg by mouth twice daily. START Cardizem 180 mg by mouth daily.  *If you need a refill on your cardiac medications before your next appointment, please call your pharmacy*  Lab Work: None today If you have labs (blood work) drawn today and your tests are completely normal, you will receive your results only by: Marland Kitchen MyChart Message (if you have MyChart) OR . A paper copy in the mail If you have any lab test that is abnormal or we need to change your treatment, we will call you to review the results.  Testing/Procedures: Your physician has requested that you have a lexiscan myoview. For further information please visit [email or web address]. Please follow instruction sheet, as given.    Follow-Up: At [facility], you and your health needs are our priority.  As part of our continuing mission to provide you with exceptional heart care, we have created designated Provider Care Teams.  These Care Teams include your primary Cardiologist (physician) and Advanced Practice Providers (APPs -  Physician Assistants and Nurse Practitioners) who all work together to provide you with the care you need, when you need it.  Your next appointment:   2 week(s)  The format for your next appointment:   In Person  Provider:   Jenne Campus, MD  Other Instructions None today

## 2019-06-05 NOTE — Progress Notes (Signed)
Virtual Visit via Telephone Note   This visit type was conducted due to national recommendations for restrictions regarding the COVID-19 Pandemic (e.g. social distancing) in an effort to limit this patient's exposure and mitigate transmission in our community.  Due to her co-morbid illnesses, this patient is at least at moderate risk for complications without adequate follow up.  This format is felt to be most appropriate for this patient at this time.  The patient did not have access to video technology/had technical difficulties with video requiring transitioning to audio format only (telephone).  All issues noted in this document were discussed and addressed.  No physical exam could be performed with this format.  Please refer to the patient's chart for her  consent to telehealth for [facility].  Evaluation Performed:  Follow-up visit  This visit type was conducted due to national recommendations for restrictions regarding the COVID-19 Pandemic (e.g. social distancing).  This format is felt to be most appropriate for this patient at this time.  All issues noted in this document were discussed and addressed.  No physical exam was performed (except for noted visual exam findings with Video Visits).  Please refer to the patient's chart (MyChart message for video visits and phone note for telephone visits) for the patient's consent to telehealth for [facility].  Date:  06/05/2019  ID: Michelle Perkins, DOB 09/06/1960, MRN [id number]   Patient Location: [place]  [place] [address]   Provider location:   [place] Office  PCP:  Ernestene Kiel, MD  Cardiologist:  Jenne Campus, MD     Chief Complaint: I have episode of atrial fibrillation  History of Present Illness:    Michelle Perkins is a 58 y.o. female  who presents via audio/video conferencing for a telehealth visit today.  With past medical history significant for paroxysmal atrial fibrillation.  She has been  relatively controlled but lately episode of atrial fibrillation become more frequent.  And today she wanted to be seen because she had episode of atrial fibrillation going on since Christmas.  She does have devised that allowed her to record EKG and she said during the time since Christmas she got one time that her heart rate was normal and regular and EKG showed sinus rhythm.  She is a Marine scientist working Designer, multimedia.  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   The patient does not have symptoms concerning for COVID-19 infection (fever, chills, cough, or new SHORTNESS OF BREATH).    Prior CV studies:   The following studies were reviewed today:       No past medical history on file.     Current Meds  Medication Sig  . apixaban (ELIQUIS) 5 MG TABS tablet Take 1 tablet (5 mg total) by mouth as needed (as needed for atrial firillation longer than 24 hours. 5 mg twice daily).  Marland Kitchen diltiazem (CARDIZEM) 30 MG tablet Take 30 mg by mouth 3 (three) times daily. If heart rate is over 120  . metoprolol succinate (TOPROL-XL) 25 MG 24 hr tablet Take 3 tablets (75 mg total) by mouth daily. (Patient taking differently: Take 100 mg by mouth daily. Feels like shes in a fib.)      Family History: The patient's family history includes Atrial fibrillation in her brother, father, mother, and sister; Heart failure in her father; Lung cancer in her  mother.   ROS:   Please see the history of present illness.     All other systems reviewed and are negative.   Labs/Other Tests and Data Reviewed:     Recent Labs: No results found for requested labs within last 8760 hours.  Recent Lipid Panel No results found for: CHOL, TRIG, HDL, CHOLHDL, VLDL, LDLCALC, LDLDIRECT    Exam:    Vital Signs:  Ht 5\' 8"  (1.727 m)   Wt 220 lb (99.8 kg)   BMI 33.45 kg/m     Wt Readings from Last 3  Encounters:  06/05/19 220 lb (99.8 kg)  12/27/18 231 lb 9.6 oz (105.1 kg)  08/15/18 224 lb 6.4 oz (101.8 kg)     Well nourished, well developed in no acute distress. Alert awake and x3 talking to me on the phone she is working in the hospital.  I am in our office in [facility].  Not in any distress  Diagnosis for this visit:   1. Paroxysmal atrial fibrillation (HCC)   2. Dyspnea on exertion   3. Palpitations      ASSESSMENT & PLAN:    1.  Paroxysmal atrial fibrillation that became more frequent it looks like now she is in atrial fibrillation.  She will send me recording of her EKG from her device, I advised her to start Eliquis.  I advised her also to start long-acting Cardizem and she will take Cardizem CD 180.  I also asked her to reduce the dose of metoprolol succinate from 100 mg daily to only 75 mg daily.  I will bring her back to the office in about 2 to 3 weeks and we will talk about potentially next step which could be starting antiarrhythmic therapy.  I will ask her to have a stress test done in the hospital to make sure it safe for her to initiate antiarrhythmic therapy. Dyspnea on exertion denies having any.  She did have echocardiogram which was normal. 3.  Palpitations related to atrial fibrillation.  Plan as outlined above  COVID-19 Education: The signs and symptoms of COVID-19 were discussed with the patient and how to seek care for testing (follow up with PCP or arrange E-visit).  The importance of social distancing was discussed today.  Patient Risk:   After full review of this patients clinical status, I feel that they are at least moderate risk at this time.  Time:   Today, I have spent 5 minutes with the patient with telehealth technology discussing pt health issues.  I spent 15 minutes reviewing her chart before the visit.  Visit was finished at 3:55 PM.    Medication Adjustments/Labs and Tests Ordered: Current medicines are reviewed at length with the patient  today.  Concerns regarding medicines are outlined above.  Orders Placed This Encounter  Procedures  . Myocardial Perfusion Imaging (lexiscan)   Medication changes:  Meds ordered this encounter  Medications  . DISCONTD: diltiazem (CARDIZEM CD) 180 MG 24 hr capsule    Sig: Take 1 capsule (180 mg total) by mouth daily.    Dispense:  90 capsule    Refill:  3  . diltiazem (CARDIZEM CD) 180 MG 24 hr capsule    Sig: Take 1 capsule (180 mg total) by mouth daily.    Dispense:  90 capsule    Refill:  3     Disposition: Follow-up few weeks  Signed, [facility], MD, [facility] 06/05/2019 4:00 PM     [facility]

## 2019-06-05 NOTE — Progress Notes (Signed)
Called pt to set up f/u but pt was at work so she will call back with her availability for an apt. Pt is aware of lexi scan in [facility] as well as added medications. Pt verbalized understanding.

## 2019-06-13 ENCOUNTER — Other Ambulatory Visit: Payer: Self-pay | Admitting: *Deleted

## 2019-06-13 ENCOUNTER — Telehealth: Payer: Self-pay | Admitting: Cardiology

## 2019-06-13 MED ORDER — METOPROLOL SUCCINATE ER 25 MG PO TB24: 75.0000 mg | ORAL_TABLET | Freq: Every day | ORAL | 1 refills | Status: DC

## 2019-06-13 MED ORDER — APIXABAN 5 MG PO TABS: 5.0000 mg | ORAL_TABLET | ORAL | 1 refills | Status: DC | PRN

## 2019-06-13 NOTE — Telephone Encounter (Signed)
*  STAT* If patient is at the pharmacy, call can be transferred to refill team.   1. Which medications need to be refilled? (please list name of each medication and dose if known) Eliquis and Metoprolol  2. Which pharmacy/location (including street and city if local pharmacy) is medication to be sent to?[facility]  3. Do they need a 30 day or 90 day supply? 90

## 2019-06-13 NOTE — Telephone Encounter (Signed)
Refill sent to [facility].

## 2019-07-03 ENCOUNTER — Other Ambulatory Visit: Payer: Self-pay

## 2019-07-03 ENCOUNTER — Telehealth (INDEPENDENT_AMBULATORY_CARE_PROVIDER_SITE_OTHER): Payer: Commercial Managed Care - PPO | Admitting: Cardiology

## 2019-07-03 ENCOUNTER — Encounter: Payer: Self-pay | Admitting: Cardiology

## 2019-07-03 ENCOUNTER — Encounter: Payer: Self-pay | Admitting: *Deleted

## 2019-07-03 VITALS — BP 138/73 | HR 68 | Ht 68.0 in

## 2019-07-03 DIAGNOSIS — I480 Paroxysmal atrial fibrillation: Secondary | ICD-10-CM

## 2019-07-03 DIAGNOSIS — R0609 Other forms of dyspnea: Secondary | ICD-10-CM

## 2019-07-03 DIAGNOSIS — R002 Palpitations: Secondary | ICD-10-CM

## 2019-07-03 MED ORDER — METOPROLOL SUCCINATE ER 25 MG PO TB24: 75.0000 mg | ORAL_TABLET | Freq: Every day | ORAL | 1 refills | Status: DC

## 2019-07-03 MED ORDER — APIXABAN 5 MG PO TABS: 5.0000 mg | ORAL_TABLET | ORAL | 1 refills | Status: DC | PRN

## 2019-07-03 NOTE — Progress Notes (Signed)
Virtual Visit via Telephone Note   This visit type was conducted due to national recommendations for restrictions regarding the COVID-19 Pandemic (e.g. social distancing) in an effort to limit this patient's exposure and mitigate transmission in our community.  Due to her co-morbid illnesses, this patient is at least at moderate risk for complications without adequate follow up.  This format is felt to be most appropriate for this patient at this time.  The patient did not have access to video technology/had technical difficulties with video requiring transitioning to audio format only (telephone).  All issues noted in this document were discussed and addressed.  No physical exam could be performed with this format.  Please refer to the patient's chart for her  consent to telehealth for [facility].  Evaluation Performed:  Follow-up visit  This visit type was conducted due to national recommendations for restrictions regarding the COVID-19 Pandemic (e.g. social distancing).  This format is felt to be most appropriate for this patient at this time.  All issues noted in this document were discussed and addressed.  No physical exam was performed (except for noted visual exam findings with Video Visits).  Please refer to the patient's chart (MyChart message for video visits and phone note for telephone visits) for the patient's consent to telehealth for [facility].  Date:  07/03/2019  ID: Michelle Perkins, DOB 09-Jun-1960, MRN [id number]   Patient Location: [place] [address]   Provider location:   [place] Office  PCP:  Ernestene Kiel, MD  Cardiologist:  Jenne Campus, MD     Chief Complaint: Still have atrial fibrillation  History of Present Illness:    Michelle Perkins is a 59 y.o. female  who presents via audio/video conferencing for a telehealth visit today.  With paroxysmal atrial fibrillation initially controlled only with beta-blocker and calcium  channel blocker, however, that became persistent right now.  She is continuously in atrial fibrillation.  We talked about options for this situation.  I think we reached the point atrial antiarrhythmic is needed.  I will schedule her to have the stress test.  If stress test is negative then will initiate therapy with flecainide.  We talked in length about potential options for this situation including atrial fibrillation ablation, however, I told her that atrial fibrillation ablation is the choice for failure of antiarrhythmic therapy .  She understood and willing to try antiarrhythmic therapy.  Likely overall seems to be doing well.  She did have Covid infection at the end of November.  Recovered nicely after that.  Denies have any chest pain tightness squeezing pressure burning chest   The patient does not have symptoms concerning for COVID-19 infection (fever, chills, cough, or new SHORTNESS OF BREATH).    Prior CV studies:   The following studies were reviewed today:       No past medical history on file.     Current Meds  Medication Sig  . apixaban (ELIQUIS) 5 MG TABS tablet Take 1 tablet (5 mg total) by mouth as needed (as needed for atrial firillation longer than 24 hours. 5 mg twice daily).  Marland Kitchen diltiazem (CARDIZEM CD) 180 MG 24 hr capsule Take 1 capsule (180 mg total) by mouth daily.  Marland Kitchen diltiazem (CARDIZEM) 30 MG tablet Take 30 mg by mouth 3 (three) times daily. If heart rate is over 120  . metoprolol succinate (TOPROL-XL) 25 MG 24 hr tablet Take 3 tablets (75 mg total) by mouth daily.  . [  DISCONTINUED] apixaban (ELIQUIS) 5 MG TABS tablet Take 1 tablet (5 mg total) by mouth as needed (as needed for atrial firillation longer than 24 hours. 5 mg twice daily).  . [DISCONTINUED] metoprolol succinate (TOPROL-XL) 25 MG 24 hr tablet Take 3 tablets (75 mg total) by mouth daily.      Family History: The patient's family history includes Atrial fibrillation in her brother, father, mother,  and sister; Heart failure in her father; Lung cancer in her mother.   ROS:   Please see the history of present illness.     All other systems reviewed and are negative.   Labs/Other Tests and Data Reviewed:     Recent Labs: No results found for requested labs within last 8760 hours.  Recent Lipid Panel No results found for: CHOL, TRIG, HDL, CHOLHDL, VLDL, LDLCALC, LDLDIRECT    Exam:    Vital Signs:  BP 138/73   Pulse 68   Ht 5\' 8"  (1.727 m)   BMI 33.45 kg/m     Wt Readings from Last 3 Encounters:  06/05/19 220 lb (99.8 kg)  12/27/18 231 lb 9.6 oz (105.1 kg)  08/15/18 224 lb 6.4 oz (101.8 kg)     Well nourished, well developed in no acute distress. Alert awake and x3 she is at her kitchen at home in hospital.  I am in our office in [facility].  She is asymptomatic doing well.  Diagnosis for this visit:   1. Paroxysmal atrial fibrillation (HCC)   2. Dyspnea on exertion   3. Palpitations      ASSESSMENT & PLAN:    1.  Persistent atrial fibrillation we will get ready for antiarrhythmic therapy, then will proceed with cardioversion in 3 weeks of antiarrhythmic therapy will not bring her back to normal rhythm.  In the meantime we will continue present dose of AV blocking agent as well as anticoagulation with anticipation of cardioversion with the near future. 2.  Dyspnea on exertion denies having any.  Was probably related to COVID-19 infection that she got. 3.  Palpitations plan as outlined above  COVID-19 Education: The signs and symptoms of COVID-19 were discussed with the patient and how to seek care for testing (follow up with PCP or arrange E-visit).  The importance of social distancing was discussed today.  Patient Risk:   After full review of this patients clinical status, I feel that they are at least moderate risk at this time.  Time:   Today, I have spent 5 minutes with the patient with telehealth technology discussing pt health issues.  I spent 15  minutes reviewing her chart before the visit.  Visit was finished at 245.    Medication Adjustments/Labs and Tests Ordered: Current medicines are reviewed at length with the patient today.  Concerns regarding medicines are outlined above.  No orders of the defined types were placed in this encounter.  Medication changes:  Meds ordered this encounter  Medications  . apixaban (ELIQUIS) 5 MG TABS tablet    Sig: Take 1 tablet (5 mg total) by mouth as needed (as needed for atrial firillation longer than 24 hours. 5 mg twice daily).    Dispense:  180 tablet    Refill:  1  . metoprolol succinate (TOPROL-XL) 25 MG 24 hr tablet    Sig: Take 3 tablets (75 mg total) by mouth daily.    Dispense:  270 tablet    Refill:  1     Disposition: 1 month  Signed, [facility],  MD, [facility] 07/03/2019 2:41 PM    [facility]

## 2019-07-03 NOTE — Patient Instructions (Signed)
Medication Instructions:  Your physician recommends that you continue on your current medications as directed. Please refer to the Current Medication list given to you today.  *If you need a refill on your cardiac medications before your next appointment, please call your pharmacy*  Lab Work: None ordered  If you have labs (blood work) drawn today and your tests are completely normal, you will receive your results only by: Marland Kitchen MyChart Message (if you have MyChart) OR . A paper copy in the mail If you have any lab test that is abnormal or we need to change your treatment, we will call you to review the results.  Testing/Procedures: Your physician has requested that you have a lexiscan myoview. For further information please visit [email or web address] Please follow instruction sheet, as given.    Follow-Up: At [facility], you and your health needs are our priority.  As part of our continuing mission to provide you with exceptional heart care, we have created designated Provider Care Teams.  These Care Teams include your primary Cardiologist (physician) and Advanced Practice Providers (APPs -  Physician Assistants and Nurse Practitioners) who all work together to provide you with the care you need, when you need it.  Your next appointment:   1 month(s)  The format for your next appointment:   Either In Person or Virtual  Provider:   Dr. Bing Matter

## 2019-07-11 DIAGNOSIS — R0600 Dyspnea, unspecified: Secondary | ICD-10-CM | POA: Diagnosis not present

## 2019-07-14 ENCOUNTER — Telehealth: Payer: Self-pay | Admitting: Emergency Medicine

## 2019-07-14 NOTE — Telephone Encounter (Signed)
Called patient informed her to start flecainide per Dr. Bing Matter. After realizing her allergy with lidocaine I consulted with Dr. Bing Matter. The patient reports lidocaine dropped her blood pressure in a dental office once and she had to leave by ambulance. Due to this we will not start flecainide at this time. Dr. Bing Matter will consult further with Dr. Elberta Fortis. The patient verbally understands.

## 2019-07-19 ENCOUNTER — Telehealth (INDEPENDENT_AMBULATORY_CARE_PROVIDER_SITE_OTHER): Payer: Commercial Managed Care - PPO | Admitting: Cardiology

## 2019-07-19 ENCOUNTER — Encounter: Payer: Self-pay | Admitting: Cardiology

## 2019-07-19 ENCOUNTER — Other Ambulatory Visit: Payer: Self-pay

## 2019-07-19 DIAGNOSIS — I480 Paroxysmal atrial fibrillation: Secondary | ICD-10-CM | POA: Diagnosis not present

## 2019-07-19 DIAGNOSIS — R0609 Other forms of dyspnea: Secondary | ICD-10-CM

## 2019-07-19 DIAGNOSIS — R002 Palpitations: Secondary | ICD-10-CM

## 2019-07-19 NOTE — Progress Notes (Signed)
Virtual Visit via Telephone Note   This visit type was conducted due to national recommendations for restrictions regarding the COVID-19 Pandemic (e.g. social distancing) in an effort to limit this patient's exposure and mitigate transmission in our community.  Due to her co-morbid illnesses, this patient is at least at moderate risk for complications without adequate follow up.  This format is felt to be most appropriate for this patient at this time.  The patient did not have access to video technology/had technical difficulties with video requiring transitioning to audio format only (telephone).  All issues noted in this document were discussed and addressed.  No physical exam could be performed with this format.  Please refer to the patient's chart for her  consent to telehealth for [facility]  Evaluation Performed:  Follow-up visit  This visit type was conducted due to national recommendations for restrictions regarding the COVID-19 Pandemic (e.g. social distancing).  This format is felt to be most appropriate for this patient at this time.  All issues noted in this document were discussed and addressed.  No physical exam was performed (except for noted visual exam findings with Video Visits).  Please refer to the patient's chart (MyChart message for video visits and phone note for telephone visits) for the patient's consent to telehealth for [facility].  Date:  07/19/2019  ID: Michelle Perkins, DOB [birth year], MRN [id number]   Patient Location: [address] [place] [address]   Provider location:   [facility] [place] [facility]  PCP:  Philemon Kingdom, MD  Cardiologist:  Gypsy Balsam, MD     Chief Complaint: Still having palpitations  History of Present Illness:    Michelle Perkins is a 59 y.o. female  who presents via audio/video conferencing for a telehealth visit today.  With paroxysmal atrial fibrillation.  She is being anticoagulated does have atrial fibrillation  going on for about a month already.  We eventually decided to go with antiarrhythmic therapy.  She did have a stress test done which was normal.  When I was getting ready to start her on flecainide however, we find out that she does have allergy to lidocaine.  One time she was at the dentist office she was given lidocaine she started getting tachycardic and hypotensive eventually she was transferred to the emergency room and everything was fine after that.  But she did not use any lidocaine after that.  Therefore choice of flecainide is probably not the best 1.  I had a long discussion with her today we talked about options for either sotalol or Rythmol.  I decided to contact the EP team to discuss this issue with them.   The patient does not have symptoms concerning for COVID-19 infection (fever, chills, cough, or new SHORTNESS OF BREATH).    Prior CV studies:   The following studies were reviewed today:       History reviewed. No pertinent past medical history.  Past Surgical History:  Procedure Laterality Date  . APPENDECTOMY       Current Meds  Medication Sig  . apixaban (ELIQUIS) 5 MG TABS tablet Take 1 tablet (5 mg total) by mouth as needed (as needed for atrial firillation longer than 24 hours. 5 mg twice daily). (Patient taking differently: Take 5 mg by mouth 2 (two) times daily. )  . diltiazem (CARDIZEM CD) 180 MG 24 hr capsule Take 1 capsule (180 mg total) by mouth daily.  Marland Kitchen diltiazem (CARDIZEM) 30 MG tablet Take 30 mg  by mouth 3 (three) times daily. If heart rate is over 120  . metoprolol succinate (TOPROL-XL) 25 MG 24 hr tablet Take 3 tablets (75 mg total) by mouth daily.      Family History: The patient's family history includes Atrial fibrillation in her brother, father, mother, and sister; Heart failure in her father; Lung cancer in her mother.   ROS:   Please see the history of present illness.     All other systems reviewed and are negative.   Labs/Other  Tests and Data Reviewed:     Recent Labs: No results found for requested labs within last 8760 hours.  Recent Lipid Panel No results found for: CHOL, TRIG, HDL, CHOLHDL, VLDL, LDLCALC, LDLDIRECT    Exam:    Vital Signs:  There were no vitals taken for this visit.    Wt Readings from Last 3 Encounters:  06/05/19 220 lb (99.8 kg)  12/27/18 231 lb 9.6 oz (105.1 kg)  08/15/18 224 lb 6.4 oz (101.8 kg)     Well nourished, well developed in no acute distress. Alert awake and x3 talking to me from her home.  Doing well asymptomatic does have some mild palpitation only  Diagnosis for this visit:   1. Paroxysmal atrial fibrillation (HCC)   2. Palpitations   3. Dyspnea on exertion      ASSESSMENT & PLAN:    1.  Paroxysmal atrial fibrillation: Debating about choice of antiarrhythmic therapy.  I will talk to EP team today to make a decision.  Continue anticoagulation.  The plan will be continue antiarrhythmic therapy for at least 3 weeks and then if she does not convert with medications we will convert her electrically. 2.  Dyspnea on exertion normal ejection fraction on echocardiogram, normal stress test. 3.  Palpitations continue monitoring.  COVID-19 Education: The signs and symptoms of COVID-19 were discussed with the patient and how to seek care for testing (follow up with PCP or arrange E-visit).  The importance of social distancing was discussed today.  Patient Risk:   After full review of this patients clinical status, I feel that they are at least moderate risk at this time.  Time:   Today, I have spent 5 minutes with the patient with telehealth technology discussing pt health issues.  I spent 50 in minutes reviewing her chart before the visit.  Visit was finished at 12:20 PM.    Medication Adjustments/Labs and Tests Ordered: Current medicines are reviewed at length with the patient today.  Concerns regarding medicines are outlined above.  No orders of the defined types  were placed in this encounter.  Medication changes: No orders of the defined types were placed in this encounter.    Disposition: Follow-up 1 month  Signed, Park Liter, MD, [facility] 07/19/2019 12:18 PM    [place]

## 2019-07-20 NOTE — Patient Instructions (Signed)
Medication Instructions:  Your physician recommends that you continue on your current medications as directed. Please refer to the Current Medication list given to you today.  *If you need a refill on your cardiac medications before your next appointment, please call your pharmacy*  Lab Work: None.  If you have labs (blood work) drawn today and your tests are completely normal, you will receive your results only by: Marland Kitchen MyChart Message (if you have MyChart) OR . A paper copy in the mail If you have any lab test that is abnormal or we need to change your treatment, we will call you to review the results.  Testing/Procedures: None.   Follow-Up: At [facility], you and your health needs are our priority.  As part of our continuing mission to provide you with exceptional heart care, we have created designated Provider Care Teams.  These Care Teams include your primary Cardiologist (physician) and Advanced Practice Providers (APPs -  Physician Assistants and Nurse Practitioners) who all work together to provide you with the care you need, when you need it.  Your next appointment:   4 days  The format for your next appointment:   In Person  Provider:   Gypsy Balsam, MD  Other Instructions

## 2019-07-24 ENCOUNTER — Other Ambulatory Visit: Payer: Self-pay

## 2019-07-24 ENCOUNTER — Ambulatory Visit (INDEPENDENT_AMBULATORY_CARE_PROVIDER_SITE_OTHER): Payer: Commercial Managed Care - PPO | Admitting: Cardiology

## 2019-07-24 ENCOUNTER — Encounter: Payer: Self-pay | Admitting: Cardiology

## 2019-07-24 VITALS — BP 120/78 | HR 80 | Ht 68.0 in | Wt 230.6 lb

## 2019-07-24 DIAGNOSIS — I4819 Other persistent atrial fibrillation: Secondary | ICD-10-CM

## 2019-07-24 DIAGNOSIS — R0609 Other forms of dyspnea: Secondary | ICD-10-CM

## 2019-07-24 DIAGNOSIS — R002 Palpitations: Secondary | ICD-10-CM | POA: Diagnosis not present

## 2019-07-24 DIAGNOSIS — R0600 Dyspnea, unspecified: Secondary | ICD-10-CM

## 2019-07-24 HISTORY — DX: Other persistent atrial fibrillation: I48.19

## 2019-07-24 MED ORDER — PROPAFENONE HCL ER 225 MG PO CP12: 225.0000 mg | ORAL_CAPSULE | Freq: Two times a day (BID) | ORAL | 2 refills | Status: DC

## 2019-07-24 NOTE — Progress Notes (Signed)
Cardiology Office Note:    Date:  07/24/2019   ID:  Michelle Perkins, DOB 05-Nov-1960, MRN [id number]  PCP:  Michelle Kingdom, MD  Cardiologist:  Michelle Balsam, MD    Referring MD: Michelle Kingdom, MD   No chief complaint on file. Doing well  History of Present Illness:    Michelle Perkins is a 59 y.o. female with paroxysmal atrial fibrillation.  Recently restarted investigating possibility of antiarrhythmic therapy.  Stress test was done which showed no evidence of ischemia, echocardiogram was done which showed structurally normal heart, I did talk to EP team about choice of antiarrhythmic initially we were thinking about flecainide, however, Michelle Perkins is allergic to lidocaine and there is correction between those allergies.  Therefore, we will decided to go with Rythmol.  I brought her today to my office to have EKG done EKG showed atrial flutter it is difficult to assess QT interval but from the V1 lead when I can clearly identified QT interval it is not prolonged.  History reviewed. No pertinent past medical history.  Past Surgical History:  Procedure Laterality Date  . APPENDECTOMY      Current Medications: Current Meds  Medication Sig  . apixaban (ELIQUIS) 5 MG TABS tablet Take 5 mg by mouth 2 (two) times daily.  Marland Kitchen diltiazem (CARDIZEM CD) 180 MG 24 hr capsule Take 1 capsule (180 mg total) by mouth daily.  Marland Kitchen diltiazem (CARDIZEM) 30 MG tablet Take 30 mg by mouth 3 (three) times daily. If heart rate is over 120  . metoprolol succinate (TOPROL-XL) 25 MG 24 hr tablet Take 3 tablets (75 mg total) by mouth daily.     Allergies:   Codeine, Levaquin [levofloxacin], Lidocaine, and Penicillins   Social History   Socioeconomic History  . Marital status: Married    Spouse name: Not on file  . Number of children: Not on file  . Years of education: Not on file  . Highest education level: Not on file  Occupational History  . Not on file  Tobacco Use  . Smoking status: Never Smoker  .  Smokeless tobacco: Never Used  Substance and Sexual Activity  . Alcohol use: Never  . Drug use: Never  . Sexual activity: Not on file  Other Topics Concern  . Not on file  Social History Narrative  . Not on file   Social Determinants of Health   Financial Resource Strain:   . Difficulty of Paying Living Expenses: Not on file  Food Insecurity:   . Worried About Programme researcher, broadcasting/film/video in the Last Year: Not on file  . Ran Out of Food in the Last Year: Not on file  Transportation Needs:   . Lack of Transportation (Medical): Not on file  . Lack of Transportation (Non-Medical): Not on file  Physical Activity:   . Days of Exercise per Week: Not on file  . Minutes of Exercise per Session: Not on file  Stress:   . Feeling of Stress : Not on file  Social Connections:   . Frequency of Communication with Friends and Family: Not on file  . Frequency of Social Gatherings with Friends and Family: Not on file  . Attends Religious Services: Not on file  . Active Member of Clubs or Organizations: Not on file  . Attends Banker Meetings: Not on file  . Marital Status: Not on file     Family History: The patient's family history includes Atrial fibrillation in her brother, father, mother, and sister;  Heart failure in her father; Lung cancer in her mother. ROS:   Please see the history of present illness.    All 14 point review of systems negative except as described per history of present illness  EKGs/Labs/Other Studies Reviewed:      Recent Labs: No results found for requested labs within last 8760 hours.  Recent Lipid Panel No results found for: CHOL, TRIG, HDL, CHOLHDL, VLDL, LDLCALC, LDLDIRECT  Physical Exam:    VS:  BP 120/78   Pulse 80   Ht 5\' 8"  (1.727 m)   Wt 230 lb 9.6 oz (104.6 kg)   SpO2 99%   BMI 35.06 kg/m     Wt Readings from Last 3 Encounters:  07/24/19 230 lb 9.6 oz (104.6 kg)  06/05/19 220 lb (99.8 kg)  12/27/18 231 lb 9.6 oz (105.1 kg)      GEN:  Well nourished, well developed in no acute distress HEENT: Normal NECK: No JVD; No carotid bruits LYMPHATICS: No lymphadenopathy CARDIAC: RRR, no murmurs, no rubs, no gallops RESPIRATORY:  Clear to auscultation without rales, wheezing or rhonchi  ABDOMEN: Soft, non-tender, non-distended MUSCULOSKELETAL:  No edema; No deformity  SKIN: Warm and dry LOWER EXTREMITIES: no swelling NEUROLOGIC:  Alert and oriented x 3 PSYCHIATRIC:  Normal affect   ASSESSMENT:    1. Palpitations   2. Persistent atrial fibrillation ([place])   3. Dyspnea on exertion    PLAN:    In order of problems listed above:  1. Palpitations related to atrial arrhythmias 2. Persistent atrial flutter: Will start propafenone SR 225 mg twice daily.  I warned her about potential side effects including dizziness or passing out investigation need to stop it.  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  His heart rate became low she can cut down her beta-blocker.  I will also schedule her to see EP team. 3. Dyspnea on exertion doing well from that point review   Medication Adjustments/Labs and Tests Ordered: Current medicines are reviewed at length with the patient today.  Concerns regarding medicines are outlined above.  No orders of the defined types were placed in this encounter.  Medication changes: No orders of the defined types were placed in this encounter.   Signed, Michelle Liter, MD, [facility] 07/24/2019 3:10 PM    [place]

## 2019-07-24 NOTE — Addendum Note (Signed)
Addended by: Lita Mains on: 07/24/2019 03:46 PM   Modules accepted: Orders

## 2019-07-24 NOTE — Patient Instructions (Addendum)
Medication Instructions:  Your physician has recommended you make the following change in your medication:   START: Rythmol sr 225 mg twice daily Schedule appointment 2 days after starting to have ekg   *If you need a refill on your cardiac medications before your next appointment, please call your pharmacy*  Lab Work: None.  If you have labs (blood work) drawn today and your tests are completely normal, you will receive your results only by: Marland Kitchen MyChart Message (if you have MyChart) OR . A paper copy in the mail If you have any lab test that is abnormal or we need to change your treatment, we will call you to review the results.  Testing/Procedures: None.   Follow-Up: At [facility], you and your health needs are our priority.  As part of our continuing mission to provide you with exceptional heart care, we have created designated Provider Care Teams.  These Care Teams include your primary Cardiologist (physician) and Advanced Practice Providers (APPs -  Physician Assistants and Nurse Practitioners) who all work together to provide you with the care you need, when you need it.  Your next appointment:   1 month(s)  The format for your next appointment:   In Person  Provider:   Gypsy Balsam, MD  Other Instructions  Dr. Bing Matter has referred you to see Dr. Elberta Fortis.   Propafenone tablets What is this medicine? PROPAFENONE (proe pa FEEN one) is an antiarrhythmic agent. It is used to treat irregular heart rhythm and can slow rapid heartbeats. This medicine can help your heart to return to and maintain a normal rhythm. This medicine may be used for other purposes; ask your health care provider or pharmacist if you have questions. COMMON BRAND NAME(S): Rythmol What should I tell my health care provider before I take this medicine? They need to know if you have any of these conditions:  heart disease  high blood levels of potassium  kidney disease  liver disease  low  blood pressure  lung disease like asthma, chronic bronchitis or emphysema  pacemaker  slow heart rate  an unusual or allergic reaction to propafenone, other medicines, foods, dyes, or preservatives  pregnant or trying to get pregnant  breast-feeding How should I use this medicine? Take this medicine by mouth with a glass of water. Follow the directions on the prescription label. You can take this medicine with or without food. Take your doses at regular intervals. Do not take your medicine more often than directed. Do not stop taking except on the advice of your doctor or health care professional. Talk to your pediatrician regarding the use of this medicine in children. Special care may be needed. Overdosage: If you think you have taken too much of this medicine contact a poison control center or emergency room at once. NOTE: This medicine is only for you. Do not share this medicine with others. What if I miss a dose? If you miss a dose, take it as soon as you can. If it is almost time for your next dose, take only that dose. Do not take double or extra doses. What may interact with this medicine? Do not take this medicine with any of the following medications:  arsenic trioxide  certain antibiotics like clarithromycin, erythromycin, grepafloxacin, pentamidine, sparfloxacin, troleandomycin  certain medicines for depression or mental illness like amoxapine, haloperidol, maprotiline, pimozide, sertindole, thioridazine, tricyclic antidepressants  certain medicines for fungal infections like fluconazole, itraconazole, ketoconazole, posaconazole, voriconazole  certain medicines for irregular heart beat like  dronedarone  certain medicines for malaria like chloroquine, halofantrine  cisapride  droperidol  levomethadyl  ranolazine This medicine may also interact with the following medications:  certain medicines for angina or blood pressure  certain medicines for asthma or  breathing difficulties like formoterol, salmeterol  certain medicines that treat or prevent blood clots like warfarin  cimetidine  cyclosporine  digoxin  diuretics  local anesthetics  other medicines that prolong the QT interval (cause an abnormal heart rhythm) like dofetilide, ziprasidone  rifampin  ritonavir  theophylline This list may not describe all possible interactions. Give your health care provider a list of all the medicines, herbs, non-prescription drugs, or dietary supplements you use. Also tell them if you smoke, drink alcohol, or use illegal drugs. Some items may interact with your medicine. What should I watch for while using this medicine? Your condition will be monitored closely when you first begin therapy. Often, this drug is first started in a hospital or other monitored health care setting. Once you are on maintenance therapy, visit your doctor or health care professional for regular checks on your progress. Because your condition and use of this medicine carry some risk, it is a good idea to carry an identification card, necklace or bracelet with details of your condition, medications, and doctor or health care professional. Dennis Bast may get drowsy or dizzy. Do not drive, use machinery, or do anything that needs mental alertness until you know how this medicine affects you. Do not stand or sit up quickly, especially if you are an older patient. This reduces the risk of dizzy or fainting spells. If you are going to have surgery, tell your doctor or health care professional that you are taking this medicine. What side effects may I notice from receiving this medicine? Side effects that you should report to your doctor or health care professional as soon as possible:  chest pain, palpitations  fever or chills  shortness of breath  swelling of feet or legs  trembling or shaking Side effects that usually do not require medical attention (report to your doctor or  health care professional if they continue or are bothersome):  blurred vision  changes in taste (a metallic or bitter taste)  constipation or diarrhea  dry mouth  headache  nausea or vomiting  tiredness or weakness This list may not describe all possible side effects. Call your doctor for medical advice about side effects. You may report side effects to FDA at 1-800-FDA-1088. Where should I keep my medicine? Keep out of the reach of children. Store at room temperature between 15 and 30 degrees C (59 and 86 degrees F). Protect from light. Keep container tightly closed. Throw away any unused medicine after the expiration date. NOTE: This sheet is a summary. It may not cover all possible information. If you have questions about this medicine, talk to your doctor, pharmacist, or health care provider.  2020 Elsevier/Gold Standard (2018-05-17 08:51:33)

## 2019-08-03 ENCOUNTER — Encounter: Payer: Self-pay | Admitting: Cardiology

## 2019-08-03 ENCOUNTER — Ambulatory Visit: Payer: Commercial Managed Care - PPO | Admitting: Cardiology

## 2019-08-03 ENCOUNTER — Other Ambulatory Visit: Payer: Self-pay

## 2019-08-03 VITALS — BP 132/68 | HR 72 | Ht 68.0 in | Wt 230.8 lb

## 2019-08-03 DIAGNOSIS — R0600 Dyspnea, unspecified: Secondary | ICD-10-CM

## 2019-08-03 DIAGNOSIS — R0609 Other forms of dyspnea: Secondary | ICD-10-CM

## 2019-08-03 DIAGNOSIS — I4819 Other persistent atrial fibrillation: Secondary | ICD-10-CM

## 2019-08-03 DIAGNOSIS — R002 Palpitations: Secondary | ICD-10-CM

## 2019-08-03 MED ORDER — METOPROLOL SUCCINATE ER 25 MG PO TB24: 25.0000 mg | ORAL_TABLET | Freq: Two times a day (BID) | ORAL | 1 refills | Status: DC

## 2019-08-03 MED ORDER — METOPROLOL SUCCINATE ER 25 MG PO TB24: 50.0000 mg | ORAL_TABLET | Freq: Every day | ORAL | 1 refills | Status: DC

## 2019-08-03 NOTE — Progress Notes (Signed)
Called pharmacy verified correct metoprolol RX with Matt. Metoprolol succinate 25 mg take 2 tablets once a day.

## 2019-08-03 NOTE — Progress Notes (Signed)
Cardiology Office Note:    Date:  08/03/2019   ID:  Michelle Perkins, DOB Aug 07, 1960, MRN [id number]  PCP:  Philemon Kingdom, MD  Cardiologist:  Gypsy Balsam, MD    Referring MD: Philemon Kingdom, MD   No chief complaint on file. Doing well  History of Present Illness:    Michelle Perkins is a 59 y.o. female with persistent atrial fibrillation.  We initiated Rythmol 225 twice daily she seems to be tolerating this medication quite well.  Continue anticoagulation.  The purpose of office visit today is to discuss cardioversion.  She is ready to do it she is frustrated with the situation.  We will proceed.  No past medical history on file.  Past Surgical History:  Procedure Laterality Date  . APPENDECTOMY      Current Medications: Current Meds  Medication Sig  . apixaban (ELIQUIS) 5 MG TABS tablet Take 5 mg by mouth 2 (two) times daily.  Marland Kitchen diltiazem (CARDIZEM CD) 180 MG 24 hr capsule Take 1 capsule (180 mg total) by mouth daily.  Marland Kitchen diltiazem (CARDIZEM) 30 MG tablet Take 30 mg by mouth 3 (three) times daily. If heart rate is over 120  . metoprolol succinate (TOPROL-XL) 25 MG 24 hr tablet Take 3 tablets (75 mg total) by mouth daily.  . propafenone (RYTHMOL SR) 225 MG 12 hr capsule Take 1 capsule (225 mg total) by mouth 2 (two) times daily.     Allergies:   Codeine, Levaquin [levofloxacin], Lidocaine, and Penicillins   Social History   Socioeconomic History  . Marital status: Married    Spouse name: Not on file  . Number of children: Not on file  . Years of education: Not on file  . Highest education level: Not on file  Occupational History  . Not on file  Tobacco Use  . Smoking status: Never Smoker  . Smokeless tobacco: Never Used  Substance and Sexual Activity  . Alcohol use: Never  . Drug use: Never  . Sexual activity: Not on file  Other Topics Concern  . Not on file  Social History Narrative  . Not on file   Social Determinants of Health   Financial Resource  Strain:   . Difficulty of Paying Living Expenses: Not on file  Food Insecurity:   . Worried About Programme researcher, broadcasting/film/video in the Last Year: Not on file  . Ran Out of Food in the Last Year: Not on file  Transportation Needs:   . Lack of Transportation (Medical): Not on file  . Lack of Transportation (Non-Medical): Not on file  Physical Activity:   . Days of Exercise per Week: Not on file  . Minutes of Exercise per Session: Not on file  Stress:   . Feeling of Stress : Not on file  Social Connections:   . Frequency of Communication with Friends and Family: Not on file  . Frequency of Social Gatherings with Friends and Family: Not on file  . Attends Religious Services: Not on file  . Active Member of Clubs or Organizations: Not on file  . Attends Banker Meetings: Not on file  . Marital Status: Not on file     Family History: The patient's family history includes Atrial fibrillation in her brother, father, mother, and sister; Heart failure in her father; Lung cancer in her mother. ROS:   Please see the history of present illness.    All 14 point review of systems negative except as described per history of  present illness  EKGs/Labs/Other Studies Reviewed:      Recent Labs: No results found for requested labs within last 8760 hours.  Recent Lipid Panel No results found for: CHOL, TRIG, HDL, CHOLHDL, VLDL, LDLCALC, LDLDIRECT  Physical Exam:    VS:  BP 132/68   Pulse 72   Ht 5\' 8"  (1.727 m)   Wt 230 lb 12.8 oz (104.7 kg)   SpO2 98%   BMI 35.09 kg/m     Wt Readings from Last 3 Encounters:  08/03/19 230 lb 12.8 oz (104.7 kg)  07/24/19 230 lb 9.6 oz (104.6 kg)  06/05/19 220 lb (99.8 kg)     GEN:  Well nourished, well developed in no acute distress HEENT: Normal NECK: No JVD; No carotid bruits LYMPHATICS: No lymphadenopathy CARDIAC: RRR, no murmurs, no rubs, no gallops RESPIRATORY:  Clear to auscultation without rales, wheezing or rhonchi  ABDOMEN: Soft,  non-tender, non-distended MUSCULOSKELETAL:  No edema; No deformity  SKIN: Warm and dry LOWER EXTREMITIES: no swelling NEUROLOGIC:  Alert and oriented x 3 PSYCHIATRIC:  Normal affect   ASSESSMENT:    1. Persistent atrial fibrillation ([place])   2. Dyspnea on exertion   3. Palpitations    PLAN:    In order of problems listed above:  1. Persistent atrial fibrillation/flutter.  Rate is controlled anticoagulated for at least 3 weeks.  Continue with propafenone.  Plan is to cardiovert electrically next week I explained procedure including all risk benefits as well as alternatives. 2. Dyspnea on exertion.  Doing well from that point review. 3. Palpitations hopefully will improve after cardioversion.   Medication Adjustments/Labs and Tests Ordered: Current medicines are reviewed at length with the patient today.  Concerns regarding medicines are outlined above.  No orders of the defined types were placed in this encounter.  Medication changes: No orders of the defined types were placed in this encounter.   Signed, Park Liter, MD, [facility] 08/03/2019 1:56 PM    [place]

## 2019-08-03 NOTE — Patient Instructions (Addendum)
Medication Instructions:  Your physician has recommended you make the following change in your medication:   DECREASE: Metoprolol to 50mg  daily  *If you need a refill on your cardiac medications before your next appointment, please call your pharmacy*  Lab Work: Your physician recommends that you return for lab work today: bmp, cbc  If you have labs (blood work) drawn today and your tests are completely normal, you will receive your results only by: Marland Kitchen MyChart Message (if you have MyChart) OR . A paper copy in the mail If you have any lab test that is abnormal or we need to change your treatment, we will call you to review the results.  Testing/Procedures:  You will be scheduled for a cardioversion.  DIET: Nothing to eat or drink after midnight except a sip of water with medications (see medication instructions below)  Medication Instructions:  TAKE ONLY 25 mg of metoprolol the night before cardioversion   Continue your anticoagulant: eliquis You will need to continue your anticoagulant after your procedure until you  are told by your  Provider that it is safe to stop   Labs: You will have labs drawn today.   You will need to have covid test 7 days before cardioversion this can be done at urgent care.   You must have a responsible person to drive you home and stay in the waiting area during your procedure. Failure to do so could result in cancellation.  Bring your insurance cards.  *Special Note: Every effort is made to have your procedure done on time. Occasionally there are emergencies that occur at the hospital that may cause delays. Please be patient if a delay does occur.    Follow-Up: At [facility], you and your health needs are our priority.  As part of our continuing mission to provide you with exceptional heart care, we have created designated Provider Care Teams.  These Care Teams include your primary Cardiologist (physician) and Advanced Practice Providers (APPs  -  Physician Assistants and Nurse Practitioners) who all work together to provide you with the care you need, when you need it.  Your next appointment:   1 month(s)  The format for your next appointment:   In Person  Provider:   Jenne Campus, MD  Other Instructions   Electrical Cardioversion Electrical cardioversion is the delivery of a jolt of electricity to restore a normal rhythm to the heart. A rhythm that is too fast or is not regular keeps the heart from pumping well. In this procedure, sticky patches or metal paddles are placed on the chest to deliver electricity to the heart from a device. This procedure may be done in an emergency if:  There is low or no blood pressure as a result of the heart rhythm.  Normal rhythm must be restored as fast as possible to protect the brain and heart from further damage.  It may save a life. This may also be a scheduled procedure for irregular or fast heart rhythms that are not immediately life-threatening. Tell a health care provider about:  Any allergies you have.  All medicines you are taking, including vitamins, herbs, eye drops, creams, and over-the-counter medicines.  Any problems you or family members have had with anesthetic medicines.  Any blood disorders you have.  Any surgeries you have had.  Any medical conditions you have.  Whether you are pregnant or may be pregnant. What are the risks? Generally, this is a safe procedure. However, problems may occur, including:  Allergic reactions to medicines.  A blood clot that breaks free and travels to other parts of your body.  The possible return of an abnormal heart rhythm within hours or days after the procedure.  Your heart stopping (cardiac arrest). This is rare. What happens before the procedure? Medicines  Your health care provider may have you start taking: ? Blood-thinning medicines (anticoagulants) so your blood does not clot as easily. ? Medicines to  help stabilize your heart rate and rhythm.  Ask your health care provider about: ? Changing or stopping your regular medicines. This is especially important if you are taking diabetes medicines or blood thinners. ? Taking medicines such as aspirin and ibuprofen. These medicines can thin your blood. Do not take these medicines unless your health care provider tells you to take them. ? Taking over-the-counter medicines, vitamins, herbs, and supplements. General instructions  Follow instructions from your health care provider about eating or drinking restrictions.  Plan to have someone take you home from the hospital or clinic.  If you will be going home right after the procedure, plan to have someone with you for 24 hours.  Ask your health care provider what steps will be taken to help prevent infection. These may include washing your skin with a germ-killing soap. What happens during the procedure?   An IV will be inserted into one of your veins.  Sticky patches (electrodes) or metal paddles may be placed on your chest.  You will be given a medicine to help you relax (sedative).  An electrical shock will be delivered. The procedure may vary among health care providers and hospitals. What can I expect after the procedure?  Your blood pressure, heart rate, breathing rate, and blood oxygen level will be monitored until you leave the hospital or clinic.  Your heart rhythm will be watched to make sure it does not change.  You may have some redness on the skin where the shocks were given. Follow these instructions at home:  Do not drive for 24 hours if you were given a sedative during your procedure.  Take over-the-counter and prescription medicines only as told by your health care provider.  Ask your health care provider how to check your pulse. Check it often.  Rest for 48 hours after the procedure or as told by your health care provider.  Avoid or limit your caffeine use as  told by your health care provider.  Keep all follow-up visits as told by your health care provider. This is important. Contact a health care provider if:  You feel like your heart is beating too quickly or your pulse is not regular.  You have a serious muscle cramp that does not go away. Get help right away if:  You have discomfort in your chest.  You are dizzy or you feel faint.  You have trouble breathing or you are short of breath.  Your speech is slurred.  You have trouble moving an arm or leg on one side of your body.  Your fingers or toes turn cold or blue. Summary  Electrical cardioversion is the delivery of a jolt of electricity to restore a normal rhythm to the heart.  This procedure may be done right away in an emergency or may be a scheduled procedure if the condition is not an emergency.  Generally, this is a safe procedure.  After the procedure, check your pulse often as told by your health care provider. This information is not intended to replace advice given  to you by your health care provider. Make sure you discuss any questions you have with your health care provider. Document Revised: 12/26/2018 Document Reviewed: 12/26/2018 Elsevier Patient Education  [place].

## 2019-08-08 ENCOUNTER — Telehealth: Payer: Self-pay | Admitting: Cardiology

## 2019-08-08 NOTE — Telephone Encounter (Signed)
Patient calling stating she was supposed to have a cardioversion scheduled, but has not heard anything. She says she was told it was going to be scheduled this Friday 3/5. Please advise.

## 2019-08-08 NOTE — Telephone Encounter (Signed)
Called patient, informed he Dr. Bing Matter informed me that she is getting second covid shot this week and amy want to hold off on the procedure since she had a reaction to the first one. She reports this is true but has decided to go ahead and get scheduled and then if  she has issues with the shot she will reschedule. I have sent cardioversion order to [facility] and records. Will call to get her scheduled.

## 2019-08-09 NOTE — Telephone Encounter (Signed)
Attempted to call patient, went to voicemail and voicemail box was full. Once able to get a hold of patient I will inform her that her cardioversion will be Monday  08/14/2019 at 730 am.

## 2019-08-11 ENCOUNTER — Ambulatory Visit: Payer: Commercial Managed Care - PPO | Admitting: Cardiology

## 2019-08-11 DIAGNOSIS — I4892 Unspecified atrial flutter: Secondary | ICD-10-CM | POA: Diagnosis not present

## 2019-08-14 ENCOUNTER — Telehealth: Payer: Self-pay | Admitting: Cardiology

## 2019-08-18 ENCOUNTER — Other Ambulatory Visit: Payer: Self-pay | Admitting: Cardiology

## 2019-08-18 MED ORDER — PROPAFENONE HCL ER 225 MG PO CP12: 225.0000 mg | ORAL_CAPSULE | Freq: Two times a day (BID) | ORAL | 0 refills | Status: DC

## 2019-08-18 MED ORDER — APIXABAN 5 MG PO TABS: 5.0000 mg | ORAL_TABLET | Freq: Two times a day (BID) | ORAL | 0 refills | Status: DC

## 2019-08-18 NOTE — Telephone Encounter (Signed)
Both the pts Eliquis and her Propafenone was sent to her confirmed pharmacy of choice with a 10 day supply, noted in pharmacy notes to fill this for a 10 day supply of each, until pts mail order prescriptions arrive, or she will be without.  Pt is aware that both RX's have been sent.  Pt verbalized understanding and gracious for all the assistance provided.

## 2019-08-18 NOTE — Telephone Encounter (Signed)
*  STAT* If patient is at the pharmacy, call can be transferred to refill team.   1. Which medications need to be refilled? (please list name of each medication and dose if known)  Need a temporary prescription for her medicine until her mail order comes for Eliquis and Propafenone  2. Which pharmacy/location (including street and city if local pharmacy) is medication to be sent to? [facility]- [address] [place]  3. Do they need a 30 day or 90 day supply? 10 days please which will be 20 for each medicine  >re

## 2019-09-06 ENCOUNTER — Ambulatory Visit (INDEPENDENT_AMBULATORY_CARE_PROVIDER_SITE_OTHER): Payer: Commercial Managed Care - PPO | Admitting: Cardiology

## 2019-09-06 ENCOUNTER — Encounter: Payer: Self-pay | Admitting: Cardiology

## 2019-09-06 ENCOUNTER — Other Ambulatory Visit: Payer: Self-pay

## 2019-09-06 VITALS — BP 122/70 | HR 60 | Ht 68.0 in | Wt 229.0 lb

## 2019-09-06 DIAGNOSIS — R002 Palpitations: Secondary | ICD-10-CM | POA: Diagnosis not present

## 2019-09-06 DIAGNOSIS — R0600 Dyspnea, unspecified: Secondary | ICD-10-CM

## 2019-09-06 DIAGNOSIS — I480 Paroxysmal atrial fibrillation: Secondary | ICD-10-CM

## 2019-09-06 DIAGNOSIS — R0609 Other forms of dyspnea: Secondary | ICD-10-CM

## 2019-09-06 NOTE — Patient Instructions (Addendum)

## 2019-09-06 NOTE — Progress Notes (Signed)
Cardiology Office Note:    Date:  09/06/2019   ID:  Michelle Perkins, DOB 11-07-1960, MRN [id number]  PCP:  Philemon Kingdom, MD  Cardiologist:  Gypsy Balsam, MD    Referring MD: Philemon Kingdom, MD   Chief Complaint  Patient presents with  . Follow-up    1 Month  Doing well  History of Present Illness:    Michelle Perkins is a 59 y.o. female with paroxysmal atrial fibrillation.  About a month ago she did have electrical cardioversion which was successful.  She is on antiarrhythmic therapy in form of Rythmol.  She is also on Toprol-XL as well as Cardizem CD.  We will continue that management.  She is also anticoagulated.  In terms of the visit was to discontinue anticoagulation.  However, she told me straight that she is very scared of stroke and she preferred to continue for couple more months to make sure she does not have any more recurrences of atrial fibrillation.  We did calculated her CHA2DS2-VASc score which equals only 1 for wounds, therefore, I told her she can stop anticoagulation.  However she prefers to continue for now.  Her bleeding score is very low.  Therefore we elected to continue for 2 months if there have been no recurrences of atrial fibrillation that she is fine with discontinuation of this therapy.  Past Medical History:  Diagnosis Date  . Dyspnea on exertion 06/06/2018  . Palpitations 06/06/2018  . Paroxysmal atrial fibrillation (HCC) 06/06/2018  . Persistent atrial fibrillation (HCC) 07/24/2019    Past Surgical History:  Procedure Laterality Date  . APPENDECTOMY      Current Medications: Current Meds  Medication Sig  . apixaban (ELIQUIS) 5 MG TABS tablet Take 1 tablet (5 mg total) by mouth 2 (two) times daily.  Marland Kitchen diltiazem (CARDIZEM CD) 180 MG 24 hr capsule Take 1 capsule (180 mg total) by mouth daily.  Marland Kitchen diltiazem (CARDIZEM) 30 MG tablet Take 30 mg by mouth 3 (three) times daily. If heart rate is over 120  . metoprolol succinate (TOPROL-XL) 25 MG 24  hr tablet Take 2 tablets (50 mg total) by mouth daily.  . propafenone (RYTHMOL SR) 225 MG 12 hr capsule Take 1 capsule (225 mg total) by mouth 2 (two) times daily.     Allergies:   Codeine, Levaquin [levofloxacin], Lidocaine, and Penicillins   Social History   Socioeconomic History  . Marital status: Married    Spouse name: Not on file  . Number of children: Not on file  . Years of education: Not on file  . Highest education level: Not on file  Occupational History  . Not on file  Tobacco Use  . Smoking status: Never Smoker  . Smokeless tobacco: Never Used  Substance and Sexual Activity  . Alcohol use: Never  . Drug use: Never  . Sexual activity: Not on file  Other Topics Concern  . Not on file  Social History Narrative  . Not on file   Social Determinants of Health   Financial Resource Strain:   . Difficulty of Paying Living Expenses:   Food Insecurity:   . Worried About Programme researcher, broadcasting/film/video in the Last Year:   . Barista in the Last Year:   Transportation Needs:   . Freight forwarder (Medical):   Marland Kitchen Lack of Transportation (Non-Medical):   Physical Activity:   . Days of Exercise per Week:   . Minutes of Exercise per Session:   Stress:   .  Feeling of Stress :   Social Connections:   . Frequency of Communication with Friends and Family:   . Frequency of Social Gatherings with Friends and Family:   . Attends Religious Services:   . Active Member of Clubs or Organizations:   . Attends Archivist Meetings:   Marland Kitchen Marital Status:      Family History: The patient's family history includes Atrial fibrillation in her brother, father, mother, and sister; Heart failure in her father; Lung cancer in her mother. ROS:   Please see the history of present illness.    All 14 point review of systems negative except as described per history of present illness  EKGs/Labs/Other Studies Reviewed:      Recent Labs: No results found for requested labs within  last 8760 hours.  Recent Lipid Panel No results found for: CHOL, TRIG, HDL, CHOLHDL, VLDL, LDLCALC, LDLDIRECT  Physical Exam:    VS:  BP 122/70   Pulse 60   Ht 5\' 8"  (1.727 m)   Wt 229 lb (103.9 kg)   SpO2 97%   BMI 34.82 kg/m     Wt Readings from Last 3 Encounters:  09/06/19 229 lb (103.9 kg)  08/03/19 230 lb 12.8 oz (104.7 kg)  07/24/19 230 lb 9.6 oz (104.6 kg)     GEN:  Well nourished, well developed in no acute distress HEENT: Normal NECK: No JVD; No carotid bruits LYMPHATICS: No lymphadenopathy CARDIAC: RRR, no murmurs, no rubs, no gallops RESPIRATORY:  Clear to auscultation without rales, wheezing or rhonchi  ABDOMEN: Soft, non-tender, non-distended MUSCULOSKELETAL:  No edema; No deformity  SKIN: Warm and dry LOWER EXTREMITIES: no swelling NEUROLOGIC:  Alert and oriented x 3 PSYCHIATRIC:  Normal affect   ASSESSMENT:    1. Paroxysmal atrial fibrillation (HCC)   2. Dyspnea on exertion   3. Palpitations    PLAN:    In order of problems listed above:  1. Paroxysmal atrial fibrillation.  EKG will be done to check the rhythm.  She feels good and have no signs and symptoms of primary care.  We will continue present management.  Please look at my discussion regarding anticoagulation above. 2. Dyspnea exertion doing much better now well within normal rhythm.  I encouraged her to exercise. 3. Palpitations.  Denies having any.  We will continue present management which include Rythmol as well as beta-blocker and calcium channel blocker.   Medication Adjustments/Labs and Tests Ordered: Current medicines are reviewed at length with the patient today.  Concerns regarding medicines are outlined above.  No orders of the defined types were placed in this encounter.  Medication changes: No orders of the defined types were placed in this encounter.   Signed, Park Liter, MD, [facility] 09/06/2019 11:19 AM    [place]

## 2019-09-25 ENCOUNTER — Institutional Professional Consult (permissible substitution): Payer: Commercial Managed Care - PPO | Admitting: Cardiology

## 2019-10-09 ENCOUNTER — Other Ambulatory Visit: Payer: Self-pay

## 2019-10-09 ENCOUNTER — Encounter: Payer: Self-pay | Admitting: Cardiology

## 2019-10-09 ENCOUNTER — Ambulatory Visit: Payer: Commercial Managed Care - PPO | Admitting: Cardiology

## 2019-10-09 VITALS — BP 124/76 | HR 63 | Ht 68.0 in | Wt 230.0 lb

## 2019-10-09 DIAGNOSIS — I480 Paroxysmal atrial fibrillation: Secondary | ICD-10-CM | POA: Diagnosis not present

## 2019-10-09 NOTE — Progress Notes (Signed)
Electrophysiology Office Note   Date:  10/09/2019   ID:  Michelle Perkins, DOB [birth year], MRN [id number]  PCP:  Philemon Kingdom, MD  Cardiologist:  Bing Matter Primary Electrophysiologist:  Holston Oyama Jorja Loa, MD    Chief Complaint: AF   History of Present Illness: Michelle Perkins is a 59 y.o. female who is being seen today for the evaluation of AF at the request of Michelle Lea, MD. Presenting today for electrophysiology evaluation.  He has a history significant for paroxysmal atrial fibrillation.  She is currently on Rythmol and Toprol-XL.  She has had a few cardioversions in the past.  Today, she denies symptoms of palpitations, chest pain, shortness of breath, orthopnea, PND, lower extremity edema, claudication, dizziness, presyncope, syncope, bleeding, or neurologic sequela. The patient is tolerating medications without difficulties. She overall feels well. Since starting her propafenone, she has had no further episodes of atrial fibrillation.   Past Medical History:  Diagnosis Date  . Dyspnea on exertion 06/06/2018  . Palpitations 06/06/2018  . Paroxysmal atrial fibrillation (HCC) 06/06/2018  . Persistent atrial fibrillation (HCC) 07/24/2019   Past Surgical History:  Procedure Laterality Date  . APPENDECTOMY       Current Outpatient Medications  Medication Sig Dispense Refill  . apixaban (ELIQUIS) 5 MG TABS tablet Take 1 tablet (5 mg total) by mouth 2 (two) times daily. 20 tablet 0  . diltiazem (CARDIZEM) 30 MG tablet Take 30 mg by mouth 3 (three) times daily. If heart rate is over 120    . metoprolol succinate (TOPROL-XL) 25 MG 24 hr tablet Take 2 tablets (50 mg total) by mouth daily. 60 tablet 1  . propafenone (RYTHMOL SR) 225 MG 12 hr capsule Take 1 capsule (225 mg total) by mouth 2 (two) times daily. 20 capsule 0  . diltiazem (CARDIZEM CD) 180 MG 24 hr capsule Take 1 capsule (180 mg total) by mouth daily. 90 capsule 3   No current facility-administered  medications for this visit.    Allergies:   Codeine, Levaquin [levofloxacin], Lidocaine, and Penicillins   Social History:  The patient  reports that she has never smoked. She has never used smokeless tobacco. She reports that she does not drink alcohol or use drugs.   Family History:  The patient's family history includes Atrial fibrillation in her brother, father, mother, and sister; Heart failure in her father; Lung cancer in her mother.    ROS:  Please see the history of present illness.   Otherwise, review of systems is positive for none.   All other systems are reviewed and negative.    PHYSICAL EXAM: VS:  BP 124/76   Pulse 63   Ht 5\' 8"  (1.727 m)   Wt 230 lb (104.3 kg)   BMI 34.97 kg/m  , BMI Body mass index is 34.97 kg/m. GEN: Well nourished, well developed, in no acute distress  HEENT: normal  Neck: no JVD, carotid bruits, or masses Cardiac: RRR; no murmurs, rubs, or gallops,no edema  Respiratory:  clear to auscultation bilaterally, normal work of breathing GI: soft, nontender, nondistended, + BS MS: no deformity or atrophy  Skin: warm and dry Neuro:  Strength and sensation are intact Psych: euthymic mood, full affect  EKG:  EKG is ordered today. Personal review of the ekg ordered shows sinus rhythm, rate 63  Recent Labs: No results found for requested labs within last 8760 hours.    Lipid Panel  No results found for: CHOL, TRIG, HDL, CHOLHDL, VLDL, LDLCALC, LDLDIRECT  Wt Readings from Last 3 Encounters:  10/09/19 230 lb (104.3 kg)  09/06/19 229 lb (103.9 kg)  08/03/19 230 lb 12.8 oz (104.7 kg)      Other studies Reviewed: Additional studies/ records that were reviewed today include: TTE 06/10/2018 Review of the above records today demonstrates:  Ejection fraction 55 to 60% Right ventricular normal size and function Mild aortic sclerosis Trace mitral regurgitation Left atrium normal size   ASSESSMENT AND PLAN:  1.  Paroxysmal atrial fibrillation:  Currently on propafenone, Toprol, carvedilol.  CHA2DS2-VASc of 1 due to gender.  Also on Eliquis. Oertli feels well and has remained in sinus rhythm since starting propafenone. I did offer her ablation, but as she is feeling well she would prefer to avoid it at this time. I Makye Radle see her back in 6 months for further discussion on the possibility of ablation.   Case discussed with referring cardiologist  Current medicines are reviewed at length with the patient today.   The patient does not have concerns regarding her medicines.  The following changes were made today:  none  Labs/ tests ordered today include:  Orders Placed This Encounter  Procedures  . EKG 12-Lead     Disposition:   FU with Tiffony Kite 6 months  Signed, Alita Waldren Meredith Leeds, MD  10/09/2019 10:50 AM     [facility] [address] [place] [address] [phone] (office) [phone] (fax)

## 2019-10-09 NOTE — Patient Instructions (Signed)
Medication Instructions:  Your physician recommends that you continue on your current medications as directed. Please refer to the Current Medication list given to you today.  *If you need a refill on your cardiac medications before your next appointment, please call your pharmacy*   Lab Work: None ordered If you have labs (blood work) drawn today and your tests are completely normal, you will receive your results only by: . MyChart Message (if you have MyChart) OR . A paper copy in the mail If you have any lab test that is abnormal or we need to change your treatment, we will call you to review the results.   Testing/Procedures: None ordered   Follow-Up: At [facility], you and your health needs are our priority.  As part of our continuing mission to provide you with exceptional heart care, we have created designated Provider Care Teams.  These Care Teams include your primary Cardiologist (physician) and Advanced Practice Providers (APPs -  Physician Assistants and Nurse Practitioners) who all work together to provide you with the care you need, when you need it.  We recommend signing up for the patient portal called "MyChart".  Sign up information is provided on this After Visit Summary.  MyChart is used to connect with patients for Virtual Visits (Telemedicine).  Patients are able to view lab/test results, encounter notes, upcoming appointments, etc.  Non-urgent messages can be sent to your provider as well.   To learn more about what you can do with MyChart, go to [email or web address].    Your next appointment:   6 month(s)  The format for your next appointment:   In Person  Provider:   Will Camnitz, MD   Thank you for choosing [facility]!!   Ester Hilley, RN [phone]    Other Instructions   Cardiac Ablation Cardiac ablation is a procedure to disable (ablate) a small amount of heart tissue in very specific places. The heart has many electrical  connections. Sometimes these connections are abnormal and can cause the heart to beat very fast or irregularly. Ablating some of the problem areas can improve the heart rhythm or return it to normal. Ablation may be done for people who:  Have Wolff-Parkinson-White syndrome.  Have fast heart rhythms (tachycardia).  Have taken medicines for an abnormal heart rhythm (arrhythmia) that were not effective or caused side effects.  Have a high-risk heartbeat that may be life-threatening. During the procedure, a small incision is made in the neck or the groin, and a long, thin, flexible tube (catheter) is inserted into the incision and moved to the heart. Small devices (electrodes) on the tip of the catheter will send out electrical currents. A type of X-ray (fluoroscopy) will be used to help guide the catheter and to provide images of the heart. Tell a health care provider about:  Any allergies you have.  All medicines you are taking, including vitamins, herbs, eye drops, creams, and over-the-counter medicines.  Any problems you or family members have had with anesthetic medicines.  Any blood disorders you have.  Any surgeries you have had.  Any medical conditions you have, such as kidney failure.  Whether you are pregnant or may be pregnant. What are the risks? Generally, this is a safe procedure. However, problems may occur, including:  Infection.  Bruising and bleeding at the catheter insertion site.  Bleeding into the chest, especially into the sac that surrounds the heart. This is a serious complication.  Stroke or blood clots.  Damage to other   structures or organs.  Allergic reaction to medicines or dyes.  Need for a permanent pacemaker if the normal electrical system is damaged. A pacemaker is a small computer that sends electrical signals to the heart and helps your heart beat normally.  The procedure not being fully effective. This may not be recognized until months later.  Repeat ablation procedures are sometimes required. What happens before the procedure?  Follow instructions from your health care provider about eating or drinking restrictions.  Ask your health care provider about: ? Changing or stopping your regular medicines. This is especially important if you are taking diabetes medicines or blood thinners. ? Taking medicines such as aspirin and ibuprofen. These medicines can thin your blood. Do not take these medicines before your procedure if your health care provider instructs you not to.  Plan to have someone take you home from the hospital or clinic.  If you will be going home right after the procedure, plan to have someone with you for 24 hours. What happens during the procedure?  To lower your risk of infection: ? Your health care team will wash or sanitize their hands. ? Your skin will be washed with soap. ? Hair may be removed from the incision area.  An IV tube will be inserted into one of your veins.  You will be given a medicine to help you relax (sedative).  The skin on your neck or groin will be numbed.  An incision will be made in your neck or your groin.  A needle will be inserted through the incision and into a large vein in your neck or groin.  A catheter will be inserted into the needle and moved to your heart.  Dye may be injected through the catheter to help your surgeon see the area of the heart that needs treatment.  Electrical currents will be sent from the catheter to ablate heart tissue in desired areas. There are three types of energy that may be used to ablate heart tissue: ? Heat (radiofrequency energy). ? Laser energy. ? Extreme cold (cryoablation).  When the necessary tissue has been ablated, the catheter will be removed.  Pressure will be held on the catheter insertion area to prevent excessive bleeding.  A bandage (dressing) will be placed over the catheter insertion area. The procedure may vary among  health care providers and hospitals. What happens after the procedure?  Your blood pressure, heart rate, breathing rate, and blood oxygen level will be monitored until the medicines you were given have worn off.  Your catheter insertion area will be monitored for bleeding. You will need to lie still for a few hours to ensure that you do not bleed from the catheter insertion area.  Do not drive for 24 hours or as long as directed by your health care provider. Summary  Cardiac ablation is a procedure to disable (ablate) a small amount of heart tissue in very specific places. Ablating some of the problem areas can improve the heart rhythm or return it to normal.  During the procedure, electrical currents will be sent from the catheter to ablate heart tissue in desired areas. This information is not intended to replace advice given to you by your health care provider. Make sure you discuss any questions you have with your health care provider. Document Revised: 11/15/2017 Document Reviewed: 04/13/2016 Elsevier Patient Education  2020 Elsevier Inc.    

## 2019-11-03 ENCOUNTER — Other Ambulatory Visit: Payer: Self-pay | Admitting: Cardiology

## 2019-11-07 ENCOUNTER — Encounter: Payer: Self-pay | Admitting: Cardiology

## 2019-11-07 ENCOUNTER — Other Ambulatory Visit: Payer: Self-pay

## 2019-11-07 ENCOUNTER — Ambulatory Visit: Payer: Commercial Managed Care - PPO | Admitting: Cardiology

## 2019-11-07 VITALS — BP 128/80 | HR 74 | Ht 68.0 in | Wt 231.8 lb

## 2019-11-07 DIAGNOSIS — R0600 Dyspnea, unspecified: Secondary | ICD-10-CM | POA: Diagnosis not present

## 2019-11-07 DIAGNOSIS — R0683 Snoring: Secondary | ICD-10-CM

## 2019-11-07 DIAGNOSIS — I480 Paroxysmal atrial fibrillation: Secondary | ICD-10-CM

## 2019-11-07 DIAGNOSIS — R002 Palpitations: Secondary | ICD-10-CM

## 2019-11-07 DIAGNOSIS — R0609 Other forms of dyspnea: Secondary | ICD-10-CM

## 2019-11-07 HISTORY — DX: Snoring: R06.83

## 2019-11-07 NOTE — Progress Notes (Signed)
Cardiology Office Note:    Date:  11/07/2019   ID:  [name], DOB [birth year], MRN [id number]  PCP:  Ernestene Kiel, MD  Cardiologist:  Jenne Campus, MD    Referring MD: Ernestene Kiel, MD   Chief Complaint  Patient presents with  . Follow-up    2 MO FU  I am doing very well  History of Present Illness:    Michelle Perkins is a 59 y.o. female with past medical history significant for paroxysmal atrial fibrillation, now successfully suppressed with Rythmol.  She did have cardioversion done about 2 months ago with maintaining sinus rhythm.  She is anticoagulated and I brought her today to talk about potentially discontinuation of anticoagulation.  Her CHA2DS2-VASc was only 1 for her gender.  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  I explained to her potential risk of continuation of anticoagulation namely bleeding however she prefers to continue for at least another 6 months.  She also described the fact that sometimes she wakes up in the middle of the night with some shortness of breath.  More she may be having sleep apnea.  I will schedule her to have a home sleep study  Past Medical History:  Diagnosis Date  . Dyspnea on exertion 06/06/2018  . Palpitations 06/06/2018  . Paroxysmal atrial fibrillation ([place]) 06/06/2018  . Persistent atrial fibrillation ([place]) 07/24/2019    Past Surgical History:  Procedure Laterality Date  . APPENDECTOMY      Current Medications: Current Meds  Medication Sig  . apixaban (ELIQUIS) 5 MG TABS tablet Take 1 tablet (5 mg total) by mouth 2 (two) times daily.  Marland Kitchen diltiazem (CARDIZEM CD) 180 MG 24 hr capsule Take 1 capsule (180 mg total) by mouth daily.  Marland Kitchen diltiazem (CARDIZEM) 30 MG tablet Take 30 mg by mouth as needed. If heart rate is over 120   .  metoprolol succinate (TOPROL-XL) 25 MG 24 hr tablet Take 2 tablets (50 mg total) by mouth daily.  . propafenone (RYTHMOL SR) 225 MG 12 hr capsule Take 1 capsule (225 mg total) by mouth 2 (two) times daily.     Allergies:   Codeine, Levaquin [levofloxacin], Lidocaine, and Penicillins   Social History   Socioeconomic History  . Marital status: Married    Spouse name: Not on file  . Number of children: Not on file  . Years of education: Not on file  . Highest education level: Not on file  Occupational History  . Not on file  Tobacco Use  . Smoking status: Never Smoker  . Smokeless tobacco: Never Used  Substance and Sexual Activity  . Alcohol use: Never  . Drug use: Never  . Sexual activity: Not on file  Other Topics Concern  . Not on file  Social History Narrative  . Not on file   Social Determinants of Health   Financial Resource Strain:   . Difficulty of Paying Living Expenses:   Food Insecurity:   . Worried About Charity fundraiser in the Last Year:   . Arboriculturist in the Last Year:   Transportation Needs:   . Film/video editor (Medical):   Marland Kitchen Lack of Transportation (Non-Medical):   Physical Activity:   . Days of Exercise per Week:   . Minutes of Exercise per Session:  Stress:   . Feeling of Stress :   Social Connections:   . Frequency of Communication with Friends and Family:   . Frequency of Social Gatherings with Friends and Family:   . Attends Religious Services:   . Active Member of Clubs or Organizations:   . Attends Banker Meetings:   Marland Kitchen Marital Status:      Family History: The patient's family history includes Atrial fibrillation in her brother, father, mother, and sister; Heart failure in her father; Lung cancer in her mother. ROS:   Please see the history of present illness.    All 14 point review of systems negative except as described per history of present illness  EKGs/Labs/Other Studies Reviewed:      Recent Labs: No  results found for requested labs within last 8760 hours.  Recent Lipid Panel No results found for: CHOL, TRIG, HDL, CHOLHDL, VLDL, LDLCALC, LDLDIRECT  Physical Exam:    VS:  BP 128/80   Pulse 74   Ht 5\' 8"  (1.727 m)   Wt 231 lb 12.8 oz (105.1 kg)   SpO2 98%   BMI 35.25 kg/m     Wt Readings from Last 3 Encounters:  11/07/19 231 lb 12.8 oz (105.1 kg)  10/09/19 230 lb (104.3 kg)  09/06/19 229 lb (103.9 kg)     GEN:  Well nourished, well developed in no acute distress HEENT: Normal NECK: No JVD; No carotid bruits LYMPHATICS: No lymphadenopathy CARDIAC: RRR, no murmurs, no rubs, no gallops RESPIRATORY:  Clear to auscultation without rales, wheezing or rhonchi  ABDOMEN: Soft, non-tender, non-distended MUSCULOSKELETAL:  No edema; No deformity  SKIN: Warm and dry LOWER EXTREMITIES: no swelling NEUROLOGIC:  Alert and oriented x 3 PSYCHIATRIC:  Normal affect   ASSESSMENT:    1. Paroxysmal atrial fibrillation (HCC)   2. Dyspnea on exertion   3. Palpitations   4. Snoring    PLAN:    In order of problems listed above:  1. Paroxysmal atrial fibrillation maintaining sinus rhythm on Rythmol which I continue no dizziness no passing out, issue of anticoagulation discussed above.  She prefers to continue because of her profession she knows how this stroke look like and she is terrified for potentially getting 1 preferred to take risk of anticoagulation. 2. Dyspnea on exertion doing well from that point review. 3. Palpitations none since last cardioversion. 4. Snoring: We will schedule her to have a sleep study   Medication Adjustments/Labs and Tests Ordered: Current medicines are reviewed at length with the patient today.  Concerns regarding medicines are outlined above.  No orders of the defined types were placed in this encounter.  Medication changes: No orders of the defined types were placed in this encounter.   Signed, 09/08/19, MD, [facility] 11/07/2019 11:49 AM      [facility]

## 2019-11-07 NOTE — Patient Instructions (Signed)
Medication Instructions:  Your physician recommends that you continue on your current medications as directed. Please refer to the Current Medication list given to you today.  *If you need a refill on your cardiac medications before your next appointment, please call your pharmacy*   Lab Work: None.  If you have labs (blood work) drawn today and your tests are completely normal, you will receive your results only by: . MyChart Message (if you have MyChart) OR . A paper copy in the mail If you have any lab test that is abnormal or we need to change your treatment, we will call you to review the results.   Testing/Procedures: Your physician has recommended that you have a sleep study. This test records several body functions during sleep, including: brain activity, eye movement, oxygen and carbon dioxide blood levels, heart rate and rhythm, breathing rate and rhythm, the flow of air through your mouth and nose, snoring, body muscle movements, and chest and belly movement.     Follow-Up: At [facility], you and your health needs are our priority.  As part of our continuing mission to provide you with exceptional heart care, we have created designated Provider Care Teams.  These Care Teams include your primary Cardiologist (physician) and Advanced Practice Providers (APPs -  Physician Assistants and Nurse Practitioners) who all work together to provide you with the care you need, when you need it.  We recommend signing up for the patient portal called "MyChart".  Sign up information is provided on this After Visit Summary.  MyChart is used to connect with patients for Virtual Visits (Telemedicine).  Patients are able to view lab/test results, encounter notes, upcoming appointments, etc.  Non-urgent messages can be sent to your provider as well.   To learn more about what you can do with MyChart, go to [email or web address].    Your next appointment:   4 month(s)  The format for your  next appointment:   In Person  Provider:   Robert Krasowski, MD   Other Instructions   Sleep Studies A sleep study (polysomnogram) is a series of tests done while you are sleeping. A sleep study records your brain waves, heart rate, breathing rate, oxygen level, and eye and leg movements. A sleep study helps your health care provider:  See how well you sleep.  Diagnose a sleep disorder.  Determine how severe your sleep disorder is.  Create a plan to treat your sleep disorder. Your health care provider may recommend a sleep study if you:  Feel sleepy on most days.  Snore loudly while sleeping.  Have unusual behaviors while you sleep, such as walking.  Have brief periods in which you stop breathing during sleep (sleepapnea).  Fall asleep suddenly during the day (narcolepsy).  Have trouble falling asleep or staying asleep (insomnia).  Feel like you need to move your legs when trying to fall asleep (restless legs syndrome).  Move your legs by flexing and extending them regularly while asleep (periodic limb movement disorder).  Act out your dreams while you sleep (sleep behavior disorder).  Feel like you cannot move when you first wake up (sleep paralysis). What tests are part of a sleep study? Most sleep studies record the following during sleep:  Brain activity.  Eye movements.  Heart rate and rhythm.  Breathing rate and rhythm.  Blood-oxygen level.  Blood pressure.  Chest and belly movement as you breathe.  Arm and leg movements.  Snoring or other noises.  Body position. Where are sleep   studies done? Sleep studies are done at sleep centers. A sleep center may be inside a hospital, office, or clinic. The room where you have the study may look like a hospital room or a hotel room. The health care providers doing the study may come in and out of the room during the study. Most of the time, they will be in another room monitoring your test as you  sleep. How are sleep studies done? Most sleep studies are done during a normal period of time for a full night of sleep. You will arrive at the study center in the evening and go home in the morning. Before the test  Bring your pajamas and toothbrush with you to the sleep study.  Do not have caffeine on the day of your sleep study.  Do not drink alcohol on the day of your sleep study.  Your health care provider will let you know if you should stop taking any of your regular medicines before the test. During the test      Round, sticky patches with sensors attached to recording wires (electrodes) are placed on your scalp, face, chest, and limbs.  Wires from all the electrodes and sensors run from your bed to a computer. The wires can be taken off and put back on if you need to get out of bed to go to the bathroom.  A sensor is placed over your nose to measure airflow.  A finger clip is put on your finger or ear to measure your blood oxygen level (pulse oximetry).  A belt is placed around your belly and a belt is placed around your chest to measure breathing movements.  If you have signs of the sleep disorder called sleep apnea during your test, you may get a treatment mask to wear for the second half of the night. ? The mask provides positive airway pressure (PAP) to help you breathe better during sleep. This may greatly improve your sleep apnea. ? You will then have all tests done again with the mask in place to see if your measurements and recordings change. After the test  A medical doctor who specializes in sleep will evaluate the results of your sleep study and share them with you and your primary health care provider.  Based on your results, your medical history, and a physical exam, you may be diagnosed with a sleep disorder, such as: ? Sleep apnea. ? Restless legs syndrome. ? Sleep-related behavior disorder. ? Sleep-related movement disorders. ? Sleep-related seizure  disorders.  Your health care team will help determine your treatment options based on your diagnosis. This may include: ? Improving your sleep habits (sleep hygiene). ? Wearing a continuous positive airway pressure (CPAP) or bi-level positive airway pressure (BPAP) mask. ? Wearing an oral device at night to improve breathing and reduce snoring. ? Taking medicines. Follow these instructions at home:  Take over-the-counter and prescription medicines only as told by your health care provider.  If you are instructed to use a CPAP or BPAP mask, make sure you use it nightly as directed.  Make any lifestyle changes that your health care provider recommends.  If you were given a device to open your airway while you sleep, use it only as told by your health care provider.  Do not use any tobacco products, such as cigarettes, chewing tobacco, and e-cigarettes. If you need help quitting, ask your health care provider.  Keep all follow-up visits as told by your health care provider. This is   important. Summary  A sleep study (polysomnogram) is a series of tests done while you are sleeping. It shows how well you sleep.  Most sleep studies are done over one full night of sleep. You will arrive at the study center in the evening and go home in the morning.  If you have signs of the sleep disorder called sleep apnea during your test, you may get a treatment mask to wear for the second half of the night.  A medical doctor who specializes in sleep will evaluate the results of your sleep study and share them with your primary health care provider. This information is not intended to replace advice given to you by your health care provider. Make sure you discuss any questions you have with your health care provider. Document Revised: 11/09/2018 Document Reviewed: 06/22/2017 Elsevier Patient Education  2020 Elsevier Inc.  

## 2019-11-16 ENCOUNTER — Other Ambulatory Visit: Payer: Self-pay | Admitting: Cardiology

## 2019-11-16 MED ORDER — METOPROLOL SUCCINATE ER 25 MG PO TB24: 50.0000 mg | ORAL_TABLET | Freq: Every day | ORAL | 3 refills | Status: DC

## 2019-11-24 ENCOUNTER — Telehealth: Payer: Self-pay | Admitting: *Deleted

## 2019-11-24 NOTE — Telephone Encounter (Signed)
-----   Message from Lita Mains, RN sent at 11/08/2019  9:55 AM EDT ----- Regarding: Please precert Please precert home sleep study for patient for snoring per Dr. Bing Matter.

## 2019-12-27 ENCOUNTER — Other Ambulatory Visit: Payer: Self-pay | Admitting: Cardiology

## 2019-12-27 DIAGNOSIS — R002 Palpitations: Secondary | ICD-10-CM

## 2019-12-27 DIAGNOSIS — I480 Paroxysmal atrial fibrillation: Secondary | ICD-10-CM

## 2019-12-27 DIAGNOSIS — R0609 Other forms of dyspnea: Secondary | ICD-10-CM

## 2019-12-29 ENCOUNTER — Other Ambulatory Visit: Payer: Self-pay | Admitting: Cardiology

## 2019-12-29 NOTE — Telephone Encounter (Signed)
Prescription refill request for Eliquis received. Indication: Atrial Fibrillation Last office visit: 11/07/2019 Dr Bing Matter Scr: 0.9 08/10/2019 Age: 59 Weight: 105.1 kg  Prescription refilled

## 2020-03-08 ENCOUNTER — Ambulatory Visit: Payer: Commercial Managed Care - PPO | Admitting: Cardiology

## 2020-03-11 ENCOUNTER — Ambulatory Visit (INDEPENDENT_AMBULATORY_CARE_PROVIDER_SITE_OTHER): Payer: Commercial Managed Care - PPO | Admitting: Cardiology

## 2020-03-11 ENCOUNTER — Other Ambulatory Visit: Payer: Self-pay

## 2020-03-11 ENCOUNTER — Encounter: Payer: Self-pay | Admitting: Cardiology

## 2020-03-11 VITALS — BP 124/68 | HR 70 | Ht 68.0 in | Wt 229.4 lb

## 2020-03-11 DIAGNOSIS — I480 Paroxysmal atrial fibrillation: Secondary | ICD-10-CM

## 2020-03-11 DIAGNOSIS — R0609 Other forms of dyspnea: Secondary | ICD-10-CM

## 2020-03-11 DIAGNOSIS — R0600 Dyspnea, unspecified: Secondary | ICD-10-CM

## 2020-03-11 DIAGNOSIS — R002 Palpitations: Secondary | ICD-10-CM

## 2020-03-11 NOTE — Patient Instructions (Signed)

## 2020-03-11 NOTE — Progress Notes (Signed)
Cardiology Office Note:    Date:  03/11/2020   ID:  Michelle Perkins, DOB 1960/11/28, MRN [id number]  PCP:  Philemon Kingdom, MD  Cardiologist:  Gypsy Balsam, MD    Referring MD: Philemon Kingdom, MD   No chief complaint on file. M doing fine  History of Present Illness:    Michelle Perkins is a 59 y.o. female with paroxysmal atrial fibrillation, successfully suppressed with propafenone.  She is have seen her last time she described 2 episode of atrial fibrillation one lasting all day in her 1 few hours.  She converted spontaneously to sinus rhythm.  Overall she is happy the way she feels she works as a Engineer, civil (consulting) I see her quite frequently and she has no difficulty doing her job.  No chest pain tightness squeezing pressure burning chest no shortness of breath no dizziness no passing out.  Past Medical History:  Diagnosis Date  . Dyspnea on exertion 06/06/2018  . Palpitations 06/06/2018  . Paroxysmal atrial fibrillation (HCC) 06/06/2018  . Persistent atrial fibrillation (HCC) 07/24/2019    Past Surgical History:  Procedure Laterality Date  . APPENDECTOMY      Current Medications: Current Meds  Medication Sig  . diltiazem (CARDIZEM) 30 MG tablet Take 30 mg by mouth as needed. If heart rate is over 120   . ELIQUIS 5 MG TABS tablet TAKE 1 TABLET BY MOUTH  TWICE DAILY  . metoprolol succinate (TOPROL-XL) 25 MG 24 hr tablet Take 2 tablets (50 mg total) by mouth daily.  . propafenone (RYTHMOL SR) 225 MG 12 hr capsule TAKE 1 CAPSULE BY MOUTH  TWICE DAILY     Allergies:   Codeine, Levaquin [levofloxacin], Lidocaine, and Penicillins   Social History   Socioeconomic History  . Marital status: Married    Spouse name: Not on file  . Number of children: Not on file  . Years of education: Not on file  . Highest education level: Not on file  Occupational History  . Not on file  Tobacco Use  . Smoking status: Never Smoker  . Smokeless tobacco: Never Used  Vaping Use  . Vaping Use:  Never used  Substance and Sexual Activity  . Alcohol use: Never  . Drug use: Never  . Sexual activity: Not on file  Other Topics Concern  . Not on file  Social History Narrative  . Not on file   Social Determinants of Health   Financial Resource Strain:   . Difficulty of Paying Living Expenses: Not on file  Food Insecurity:   . Worried About Programme researcher, broadcasting/film/video in the Last Year: Not on file  . Ran Out of Food in the Last Year: Not on file  Transportation Needs:   . Lack of Transportation (Medical): Not on file  . Lack of Transportation (Non-Medical): Not on file  Physical Activity:   . Days of Exercise per Week: Not on file  . Minutes of Exercise per Session: Not on file  Stress:   . Feeling of Stress : Not on file  Social Connections:   . Frequency of Communication with Friends and Family: Not on file  . Frequency of Social Gatherings with Friends and Family: Not on file  . Attends Religious Services: Not on file  . Active Member of Clubs or Organizations: Not on file  . Attends Banker Meetings: Not on file  . Marital Status: Not on file     Family History: The patient's family history includes Atrial fibrillation  in her brother, father, mother, and sister; Heart failure in her father; Lung cancer in her mother. ROS:   Please see the history of present illness.    All 14 point review of systems negative except as described per history of present illness  EKGs/Labs/Other Studies Reviewed:      Recent Labs: No results found for requested labs within last 8760 hours.  Recent Lipid Panel No results found for: CHOL, TRIG, HDL, CHOLHDL, VLDL, LDLCALC, LDLDIRECT  Physical Exam:    VS:  BP 124/68   Pulse 70   Ht 5\' 8"  (1.727 m)   Wt 229 lb 6.4 oz (104.1 kg)   SpO2 98%   BMI 34.88 kg/m     Wt Readings from Last 3 Encounters:  03/11/20 229 lb 6.4 oz (104.1 kg)  11/07/19 231 lb 12.8 oz (105.1 kg)  10/09/19 230 lb (104.3 kg)     GEN:  Well  nourished, well developed in no acute distress HEENT: Normal NECK: No JVD; No carotid bruits LYMPHATICS: No lymphadenopathy CARDIAC: RRR, no murmurs, no rubs, no gallops RESPIRATORY:  Clear to auscultation without rales, wheezing or rhonchi  ABDOMEN: Soft, non-tender, non-distended MUSCULOSKELETAL:  No edema; No deformity  SKIN: Warm and dry LOWER EXTREMITIES: no swelling NEUROLOGIC:  Alert and oriented x 3 PSYCHIATRIC:  Normal affect   ASSESSMENT:    1. Paroxysmal atrial fibrillation (HCC)   2. Palpitations   3. Dyspnea on exertion    PLAN:    In order of problems listed above:  1. Paroxysmal atrial fibrillation: Doing well from that point review we talked about options for the situation including atrial fibrillation ablation..  She is not interested.  We also talked about anticoagulation.  Her chads 2 Vascor equals 0 she is a woman however there is no additional risk therefore her chads 2 vascular equals 0.  I told her that she does not need to be anticoagulated.  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  Luckily, her has bled score is 0, therefore we will continue as per her wishes with understanding that this is not standard approach.  When she is ready to take risk of bleeding. 2. Palpitations related to paroxysmal atrial fibrillation but doing well with propafenone. 3. Dyspnea on exertion denies having any   Medication Adjustments/Labs and Tests Ordered: Current medicines are reviewed at length with the patient today.  Concerns regarding medicines are outlined above.  No orders of the defined types were placed in this encounter.  Medication changes: No orders of the defined types were placed in this  encounter.   Signed, 12/09/19, MD, [facility] 03/11/2020 10:28 AM    [facility]

## 2020-04-30 ENCOUNTER — Other Ambulatory Visit: Payer: Self-pay | Admitting: Cardiology

## 2020-06-06 ENCOUNTER — Other Ambulatory Visit: Payer: Self-pay | Admitting: Cardiology

## 2020-06-06 NOTE — Telephone Encounter (Signed)
Prescription refill request for Eliquis received. Indication: atrial fibrillation Last office visit: 03/2020 krasowski Scr: 0.9 08/2019 Age:59  Weight:104.1 kg  Prescription refilled

## 2020-06-12 ENCOUNTER — Telehealth (INDEPENDENT_AMBULATORY_CARE_PROVIDER_SITE_OTHER): Payer: Commercial Managed Care - PPO | Admitting: Cardiology

## 2020-06-12 ENCOUNTER — Encounter: Payer: Self-pay | Admitting: Cardiology

## 2020-06-12 VITALS — Ht 68.0 in | Wt 220.0 lb

## 2020-06-12 DIAGNOSIS — I4819 Other persistent atrial fibrillation: Secondary | ICD-10-CM | POA: Diagnosis not present

## 2020-06-12 DIAGNOSIS — R0600 Dyspnea, unspecified: Secondary | ICD-10-CM

## 2020-06-12 DIAGNOSIS — R002 Palpitations: Secondary | ICD-10-CM

## 2020-06-12 DIAGNOSIS — R0609 Other forms of dyspnea: Secondary | ICD-10-CM

## 2020-06-12 NOTE — Patient Instructions (Signed)
Medication Instructions:  Your physician recommends that you continue on your current medications as directed. Please refer to the Current Medication list given to you today.  *If you need a refill on your cardiac medications before your next appointment, please call your pharmacy*   Lab Work: None If you have labs (blood work) drawn today and your tests are completely normal, you will receive your results only by: Marland Kitchen MyChart Message (if you have MyChart) OR . A paper copy in the mail If you have any lab test that is abnormal or we need to change your treatment, we will call you to review the results.   Testing/Procedures: None   Follow-Up: At [facility], you and your health needs are our priority.  As part of our continuing mission to provide you with exceptional heart care, we have created designated Provider Care Teams.  These Care Teams include your primary Cardiologist (physician) and Advanced Practice Providers (APPs -  Physician Assistants and Nurse Practitioners) who all work together to provide you with the care you need, when you need it.  We recommend signing up for the patient portal called "MyChart".  Sign up information is provided on this After Visit Summary.  MyChart is used to connect with patients for Virtual Visits (Telemedicine).  Patients are able to view lab/test results, encounter notes, upcoming appointments, etc.  Non-urgent messages can be sent to your provider as well.   To learn more about what you can do with MyChart, go to [email or web address].    Your next appointment:   Please call when want to be seen next.

## 2020-06-12 NOTE — Progress Notes (Signed)
Virtual Visit via Telephone Note   This visit type was conducted due to national recommendations for restrictions regarding the COVID-19 Pandemic (e.g. social distancing) in an effort to limit this patient's exposure and mitigate transmission in our community.  Due to her co-morbid illnesses, this patient is at least at moderate risk for complications without adequate follow up.  This format is felt to be most appropriate for this patient at this time.  The patient did not have access to video technology/had technical difficulties with video requiring transitioning to audio format only (telephone).  All issues noted in this document were discussed and addressed.  No physical exam could be performed with this format.  Please refer to the patient's chart for her  consent to telehealth for [facility].  Evaluation Performed:  Follow-up visit  This visit type was conducted due to national recommendations for restrictions regarding the COVID-19 Pandemic (e.g. social distancing).  This format is felt to be most appropriate for this patient at this time.  All issues noted in this document were discussed and addressed.  No physical exam was performed (except for noted visual exam findings with Video Visits).  Please refer to the patient's chart (MyChart message for video visits and phone note for telephone visits) for the patient's consent to telehealth for [facility].  Date:  06/12/2020  ID: Michelle Perkins, DOB Oct 25, 1960, MRN [id number]   Patient Location: [address] [place] [address]   Provider location:   [facility]  PCP:  Michelle Kingdom, MD  Cardiologist:  Michelle Balsam, MD     Chief Complaint: I have atrial fibrillation  History of Present Illness:    Michelle Perkins is a 60 y.o. female  who presents via audio/video conferencing for a telehealth visit today.  She called me yesterday telling me that she said atrial fibrillation.  Her past medical history is  significant for paroxysmal atrial fibrillation successfully suppressed with Norpace, she did have 1 cardioversion done a few months ago.  Apparently she was having some sinus infection she was feeling very poorly and then she realized her heart rate was speeding up.  She does have device that allowed her to check her EKG and sure enough she was in atrial fibrillation.  She did not take any decongestant.  She supposed to have a physical visit with me today however because of my exposure to Covid we decided to do televisit solely.  She said that yesterday she took some extra dose of Cardizem 30 because of heart rate being more than 130 eventually she ended up taking some extra dose of beta-blocker and then she was awakened in the middle of the night not feeling well with heart rate of 40-45.  Today she sent me another rhythm strip showing atrial fibrillation with controlled ventricular rate and she is feeling good today.  I asked her to take her regular medications especially Eliquis and we reached the conclusion that we can await couple more days to see if she converted to sinus rhythm if not will make him make more arrangements for cardioversion.  She does have any chest pain tightness squeezing pressure burning chest no shortness of breath no swelling of lower extremities   The patient does not have symptoms concerning for COVID-19 infection (fever, chills, cough, or new SHORTNESS OF BREATH).    Prior CV studies:   The following studies were reviewed today:       Past Medical History:  Diagnosis Date  .  Dyspnea on exertion 06/06/2018  . Palpitations 06/06/2018  . Paroxysmal atrial fibrillation (HCC) 06/06/2018  . Persistent atrial fibrillation (HCC) 07/24/2019  . Snoring 11/07/2019    Past Surgical History:  Procedure Laterality Date  . APPENDECTOMY       Current Meds  Medication Sig  . CARTIA XT 180 MG 24 hr capsule TAKE 1 CAPSULE BY MOUTH  DAILY  . diltiazem (CARDIZEM) 30 MG tablet  Take 30 mg by mouth as needed. If heart rate is over 120  . ELIQUIS 5 MG TABS tablet TAKE 1 TABLET BY MOUTH  TWICE DAILY  . metoprolol succinate (TOPROL-XL) 25 MG 24 hr tablet Take 2 tablets (50 mg total) by mouth daily.  . propafenone (RYTHMOL SR) 225 MG 12 hr capsule TAKE 1 CAPSULE BY MOUTH  TWICE DAILY      Family History: The patient's family history includes Atrial fibrillation in her brother, father, mother, and sister; Heart failure in her father; Lung cancer in her mother.   ROS:   Please see the history of present illness.     All other systems reviewed and are negative.   Labs/Other Tests and Data Reviewed:     Recent Labs: No results found for requested labs within last 8760 hours.  Recent Lipid Panel No results found for: CHOL, TRIG, HDL, CHOLHDL, VLDL, LDLCALC, LDLDIRECT    Exam:    Vital Signs:  Ht 5\' 8"  (1.727 m)   Wt 220 lb (99.8 kg)   BMI 33.45 kg/m     Wt Readings from Last 3 Encounters:  06/12/20 220 lb (99.8 kg)  03/11/20 229 lb 6.4 oz (104.1 kg)  11/07/19 231 lb 12.8 oz (105.1 kg)     Well nourished, well developed in no acute distress. 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  Not in distress  Diagnosis for this visit:   No diagnosis found.   ASSESSMENT & PLAN:    1.  Paroxysmal atrial fibrillation.  Now is persistent atrial fibrillation.  She is anticoagulated her rate seems to be controlled we wait a few more days before making decision about potential cardioversion.  I told her if she develop shortness of breath chest pain tightness dizziness she needs to go to the emergency room. 2.  Dyspnea on exertion denies having any. 3.  Palpitations related to atrial fibrillation. 4.  High medication risk in use.  No dizziness or passing out  COVID-19 Education: The signs and symptoms of COVID-19 were discussed with the patient and how to seek care for testing (follow up with PCP or arrange E-visit).  The importance of social  distancing was discussed today.  Patient Risk:   After full review of this patients clinical status, I feel that they are at least moderate risk at this time.  Time:   Today, I have spent 15 minutes with the patient with telehealth technology discussing pt health issues.  I spent 15 minutes reviewing her chart before the visit.  Visit was finished at 9:00 in the morning.    Medication Adjustments/Labs and Tests Ordered: Current medicines are reviewed at length with the patient today.  Concerns regarding medicines are outlined above.  No orders of the defined types were placed in this encounter.  Medication changes: No orders of the defined types were placed in this encounter.    Disposition: We will contact you over the phone on Friday  Signed, Wednesday, MD, [facility] 06/12/2020 2:17 PM    [facility]

## 2020-08-23 ENCOUNTER — Telehealth: Payer: Self-pay | Admitting: Cardiology

## 2020-08-23 NOTE — Telephone Encounter (Signed)
Called patient added her to Dr. Vanetta Shawl schedule next week.

## 2020-08-23 NOTE — Telephone Encounter (Signed)
Spoke with the patient who states that she had a tragic family event recently and she has been in A Fib ever since. She states heart rate has been in the 110's-120's. She denies any symptoms but can tell that she is in Afib. She is taking her diltiazem which she states has been helping some. Advised her to continue diltiazem. She is wanting an appointment with Dr. Bing Matter next week, however I do not see any openings.

## 2020-08-23 NOTE — Telephone Encounter (Signed)
Please double book and get appointment for [facility]

## 2020-08-23 NOTE — Telephone Encounter (Signed)
Patient c/o Palpitations:  High priority if patient c/o lightheadedness, shortness of breath, or chest pain  1) How long have you had palpitations/irregular HR/ Afib? Are you having the symptoms now? For a couple months and having symptoms now  2) Are you currently experiencing lightheadedness, SOB or CP? no  3) Do you have a history of afib (atrial fibrillation) or irregular heart rhythm? yes  4) Have you checked your BP or HR? (document readings if available): 115 HR             BP-no  5) Are you experiencing any other symptoms? No  Patient states that she spoke with Dr. Bing Matter in the hospital and he advise her to call and make an appt for next week. Patient also states that she would like a appt for either Tuesday for Friday of next week. Please advice

## 2020-08-27 ENCOUNTER — Ambulatory Visit: Payer: Commercial Managed Care - PPO | Admitting: Cardiology

## 2020-08-27 ENCOUNTER — Encounter: Payer: Self-pay | Admitting: Cardiology

## 2020-08-27 ENCOUNTER — Other Ambulatory Visit: Payer: Self-pay

## 2020-08-27 VITALS — BP 138/68 | HR 61 | Ht 68.0 in | Wt 225.0 lb

## 2020-08-27 DIAGNOSIS — R0600 Dyspnea, unspecified: Secondary | ICD-10-CM | POA: Diagnosis not present

## 2020-08-27 DIAGNOSIS — I4892 Unspecified atrial flutter: Secondary | ICD-10-CM | POA: Diagnosis not present

## 2020-08-27 DIAGNOSIS — R0609 Other forms of dyspnea: Secondary | ICD-10-CM

## 2020-08-27 DIAGNOSIS — R002 Palpitations: Secondary | ICD-10-CM

## 2020-08-27 DIAGNOSIS — I480 Paroxysmal atrial fibrillation: Secondary | ICD-10-CM | POA: Diagnosis not present

## 2020-08-27 MED ORDER — METOPROLOL SUCCINATE ER 50 MG PO TB24: 50.0000 mg | ORAL_TABLET | Freq: Every day | ORAL | 1 refills | Status: DC

## 2020-08-27 NOTE — Patient Instructions (Signed)
Medication Instructions:  Your physician recommends that you continue on your current medications as directed. Please refer to the Current Medication list given to you today.  *If you need a refill on your cardiac medications before your next appointment, please call your pharmacy*   Lab Work: Your physician recommends that you return for lab work 3 days before cardioversion: bmp, cbc  If you have labs (blood work) drawn today and your tests are completely normal, you will receive your results only by: Marland Kitchen MyChart Message (if you have MyChart) OR . A paper copy in the mail If you have any lab test that is abnormal or we need to change your treatment, we will call you to review the results.   Testing/Procedures:  You will be scheduled for a Cardioversion  DIET: Nothing to eat or drink after midnight except a sip of water with medications (see medication instructions below)  Medication Instructions:  Continue your anticoagulant: eliquis   You will need to continue your anticoagulant after your procedure until you  are told by your  Provider that it is safe to stop   Labs: two days before cardioversion   You will also need to be tested for covid 3 days before. Please call us when scheduled and we can make you a appointment for this.     You must have a responsible person to drive you home and stay in the waiting area during your procedure. Failure to do so could result in cancellation.  Bring your insurance cards.  *Special Note: Every effort is made to have your procedure done on time. Occasionally there are emergencies that occur at the hospital that may cause delays. Please be patient if a delay does occur.     Follow-Up: At [facility], you and your health needs are our priority.  As part of our continuing mission to provide you with exceptional heart care, we have created designated Provider Care Teams.  These Care Teams include your primary Cardiologist (physician) and  Advanced Practice Providers (APPs -  Physician Assistants and Nurse Practitioners) who all work together to provide you with the care you need, when you need it.  We recommend signing up for the patient portal called "MyChart".  Sign up information is provided on this After Visit Summary.  MyChart is used to connect with patients for Virtual Visits (Telemedicine).  Patients are able to view lab/test results, encounter notes, upcoming appointments, etc.  Non-urgent messages can be sent to your provider as well.   To learn more about what you can do with MyChart, go to [email or web address].    Your next appointment:   3 month(s)  The format for your next appointment:   In Person  Provider:   Gypsy Balsam, MD   Other Instructions   Electrical Cardioversion Electrical cardioversion is the delivery of a jolt of electricity to restore a normal rhythm to the heart. A rhythm that is too fast or is not regular keeps the heart from pumping well. In this procedure, sticky patches or metal paddles are placed on the chest to deliver electricity to the heart from a device. This procedure may be done in an emergency if:  There is low or no blood pressure as a result of the heart rhythm.  Normal rhythm must be restored as fast as possible to protect the brain and heart from further damage.  It may save a life. This may also be a scheduled procedure for irregular or fast heart rhythms that are  not immediately life-threatening. Tell a health care provider about:  Any allergies you have.  All medicines you are taking, including vitamins, herbs, eye drops, creams, and over-the-counter medicines.  Any problems you or family members have had with anesthetic medicines.  Any blood disorders you have.  Any surgeries you have had.  Any medical conditions you have.  Whether you are pregnant or may be pregnant. What are the risks? Generally, this is a safe procedure. However, problems may  occur, including:  Allergic reactions to medicines.  A blood clot that breaks free and travels to other parts of your body.  The possible return of an abnormal heart rhythm within hours or days after the procedure.  Your heart stopping (cardiac arrest). This is rare. What happens before the procedure? Medicines  Your health care provider may have you start taking: ? Blood-thinning medicines (anticoagulants) so your blood does not clot as easily. ? Medicines to help stabilize your heart rate and rhythm.  Ask your health care provider about: ? Changing or stopping your regular medicines. This is especially important if you are taking diabetes medicines or blood thinners. ? Taking medicines such as aspirin and ibuprofen. These medicines can thin your blood. Do not take these medicines unless your health care provider tells you to take them. ? Taking over-the-counter medicines, vitamins, herbs, and supplements. General instructions  Follow instructions from your health care provider about eating or drinking restrictions.  Plan to have someone take you home from the hospital or clinic.  If you will be going home right after the procedure, plan to have someone with you for 24 hours.  Ask your health care provider what steps will be taken to help prevent infection. These may include washing your skin with a germ-killing soap. What happens during the procedure?  An IV will be inserted into one of your veins.  Sticky patches (electrodes) or metal paddles may be placed on your chest.  You will be given a medicine to help you relax (sedative).  An electrical shock will be delivered. The procedure may vary among health care providers and hospitals.   What can I expect after the procedure?  Your blood pressure, heart rate, breathing rate, and blood oxygen level will be monitored until you leave the hospital or clinic.  Your heart rhythm will be watched to make sure it does not  change.  You may have some redness on the skin where the shocks were given. Follow these instructions at home:  Do not drive for 24 hours if you were given a sedative during your procedure.  Take over-the-counter and prescription medicines only as told by your health care provider.  Ask your health care provider how to check your pulse. Check it often.  Rest for 48 hours after the procedure or as told by your health care provider.  Avoid or limit your caffeine use as told by your health care provider.  Keep all follow-up visits as told by your health care provider. This is important. Contact a health care provider if:  You feel like your heart is beating too quickly or your pulse is not regular.  You have a serious muscle cramp that does not go away. Get help right away if:  You have discomfort in your chest.  You are dizzy or you feel faint.  You have trouble breathing or you are short of breath.  Your speech is slurred.  You have trouble moving an arm or leg on one side of your  body.  Your fingers or toes turn cold or blue. Summary  Electrical cardioversion is the delivery of a jolt of electricity to restore a normal rhythm to the heart.  This procedure may be done right away in an emergency or may be a scheduled procedure if the condition is not an emergency.  Generally, this is a safe procedure.  After the procedure, check your pulse often as told by your health care provider. This information is not intended to replace advice given to you by your health care provider. Make sure you discuss any questions you have with your health care provider. Document Revised: 12/26/2018 Document Reviewed: 12/26/2018 Elsevier Patient Education  2021 [employer].

## 2020-08-27 NOTE — Progress Notes (Signed)
Bmp

## 2020-08-27 NOTE — Progress Notes (Signed)
Cardiology Office Note:    Date:  08/27/2020   ID:  Michelle Perkins, DOB September 04, 1960, MRN [id number]  PCP:  Philemon Kingdom, MD  Cardiologist:  Gypsy Balsam, MD    Referring MD: Philemon Kingdom, MD   Chief Complaint  Patient presents with  . Atrial Fibrillation    History of Present Illness:    Michelle Perkins is a 60 y.o. female with past medical history significant for paroxysmal atrial fibrillation so far being successfully suppressed with propafenone.  However she did have some breakthrough arrhythmia.  Also few weeks ago tragedy strikes apparently her son-in-law committed suicide.  Since that time she still having episode of palpitations.  She does have Kardia device which allowed her to record EKG and sure enough she appears to be a atrial flutter with relatively controlled ventricular rate.  She is not feeling well with this arrhythmia.  She complained of having palpitations weakness fatigue tiredness.  Time she requested to be seen to create some plan what to do about this entire scenario.  She is being anticoagulated with Eliquis. Denies having chest pain tightness squeezing pressure burning chest just fatigue and tiredness and palpitations.  She works as a Engineer, civil (consulting) in the hospital.  Past Medical History:  Diagnosis Date  . Dyspnea on exertion 06/06/2018  . Palpitations 06/06/2018  . Paroxysmal atrial fibrillation (HCC) 06/06/2018  . Persistent atrial fibrillation (HCC) 07/24/2019  . Snoring 11/07/2019    Past Surgical History:  Procedure Laterality Date  . APPENDECTOMY      Current Medications: Current Meds  Medication Sig  . CARTIA XT 180 MG 24 hr capsule TAKE 1 CAPSULE BY MOUTH  DAILY  . diltiazem (CARDIZEM) 30 MG tablet Take 30 mg by mouth as needed (tachycardia). If heart rate is over 120  . ELIQUIS 5 MG TABS tablet TAKE 1 TABLET BY MOUTH  TWICE DAILY  . metoprolol succinate (TOPROL-XL) 25 MG 24 hr tablet Take 2 tablets (50 mg total) by mouth daily.  .  propafenone (RYTHMOL SR) 225 MG 12 hr capsule TAKE 1 CAPSULE BY MOUTH  TWICE DAILY     Allergies:   Codeine, Levaquin [levofloxacin], Lidocaine, and Penicillins   Social History   Socioeconomic History  . Marital status: Married    Spouse name: Not on file  . Number of children: Not on file  . Years of education: Not on file  . Highest education level: Not on file  Occupational History  . Not on file  Tobacco Use  . Smoking status: Never Smoker  . Smokeless tobacco: Never Used  Vaping Use  . Vaping Use: Never used  Substance and Sexual Activity  . Alcohol use: Never  . Drug use: Never  . Sexual activity: Not on file  Other Topics Concern  . Not on file  Social History Narrative  . Not on file   Social Determinants of Health   Financial Resource Strain: Not on file  Food Insecurity: Not on file  Transportation Needs: Not on file  Physical Activity: Not on file  Stress: Not on file  Social Connections: Not on file     Family History: The patient's family history includes Atrial fibrillation in her brother, father, mother, and sister; Heart failure in her father; Lung cancer in her mother. ROS:   Please see the history of present illness.    All 14 point review of systems negative except as described per history of present illness  EKGs/Labs/Other Studies Reviewed:  Recent Labs: No results found for requested labs within last 8760 hours.  Recent Lipid Panel No results found for: CHOL, TRIG, HDL, CHOLHDL, VLDL, LDLCALC, LDLDIRECT  Physical Exam:    VS:  BP 138/68 (BP Location: Left Arm, Patient Position: Sitting)   Pulse 61   Ht 5\' 8"  (1.727 m)   Wt 225 lb (102.1 kg)   SpO2 97%   BMI 34.21 kg/m     Wt Readings from Last 3 Encounters:  08/27/20 225 lb (102.1 kg)  06/12/20 220 lb (99.8 kg)  03/11/20 229 lb 6.4 oz (104.1 kg)     GEN:  Well nourished, well developed in no acute distress HEENT: Normal NECK: No JVD; No carotid bruits LYMPHATICS: No  lymphadenopathy CARDIAC: RRR, no murmurs, no rubs, no gallops RESPIRATORY:  Clear to auscultation without rales, wheezing or rhonchi  ABDOMEN: Soft, non-tender, non-distended MUSCULOSKELETAL:  No edema; No deformity  SKIN: Warm and dry LOWER EXTREMITIES: no swelling NEUROLOGIC:  Alert and oriented x 3 PSYCHIATRIC:  Normal affect   ASSESSMENT:    1. Paroxysmal atrial fibrillation (HCC)   2. Paroxysmal atrial flutter (HCC)   3. Palpitations   4. Dyspnea on exertion    PLAN:    In order of problems listed above:  1. Paroxysmal atrial flutter.  This is persistent atrial flutter right now.  We did talk about options for the situation.  I think reaching the point of ablation need to be considered.  I will not refer her back to our EP team for consideration of this procedure.  In the meantime we will make arrangements for cardioversion.  She is taking anticoagulation which I will continue.  We will continue present dose of Norpace. 2. Paroxysmal atrial fibrillation, plan as above. 3. Palpitations related to atrial flutter/fibrillation continue present management. 4. Dyspnea on exertion related to atrial flutter fibrillation.  She is clearly very symptomatic with this arrhythmia and we need to do everything we can to keep her in normal rhythm.   Medication Adjustments/Labs and Tests Ordered: Current medicines are reviewed at length with the patient today.  Concerns regarding medicines are outlined above.  Orders Placed This Encounter  Procedures  . Basic metabolic panel  . CBC  . EKG 12-Lead   Medication changes: No orders of the defined types were placed in this encounter.   Signed, 05/11/20, MD, [facility] 08/27/2020 10:47 AM    [facility]

## 2020-08-27 NOTE — Addendum Note (Signed)
Addended by: Hazle Quant on: 08/27/2020 11:02 AM   Modules accepted: Orders

## 2020-09-02 ENCOUNTER — Ambulatory Visit: Payer: Commercial Managed Care - PPO | Admitting: Cardiology

## 2020-09-02 ENCOUNTER — Other Ambulatory Visit: Payer: Self-pay

## 2020-09-02 ENCOUNTER — Encounter: Payer: Self-pay | Admitting: Cardiology

## 2020-09-02 VITALS — BP 126/68 | HR 106 | Ht 68.0 in | Wt 224.0 lb

## 2020-09-02 DIAGNOSIS — I480 Paroxysmal atrial fibrillation: Secondary | ICD-10-CM | POA: Diagnosis not present

## 2020-09-02 NOTE — Patient Instructions (Signed)
Medication Instructions:  Your physician recommends that you continue on your current medications as directed. Please refer to the Current Medication list given to you today.  *If you need a refill on your cardiac medications before your next appointment, please call your pharmacy*   Lab Work: None ordered If you have labs (blood work) drawn today and your tests are completely normal, you will receive your results only by: Marland Kitchen MyChart Message (if you have MyChart) OR . A paper copy in the mail If you have any lab test that is abnormal or we need to change your treatment, we will call you to review the results.   Testing/Procedures: Your physician has requested that you have cardiac CT within 7 days PRIOR to ablation. Cardiac computed tomography (CT) is a painless test that uses an x-ray machine to take clear, detailed pictures of your heart. Please follow instruction sheet below located under "other instructions".    Your physician has recommended that you have an ablation. Catheter ablation is a medical procedure used to treat some cardiac arrhythmias (irregular heartbeats). During catheter ablation, a long, thin, flexible tube is put into a blood vessel in your groin (upper thigh), or neck. This tube is called an ablation catheter. It is then guided to your heart through the blood vessel. Radio frequency waves destroy small areas of heart tissue where abnormal heartbeats may cause an arrhythmia to start. Please follow instruction sheet below located under "other instructions".    Follow-Up: At [facility], you and your health needs are our priority.  As part of our continuing mission to provide you with exceptional heart care, we have created designated Provider Care Teams.  These Care Teams include your primary Cardiologist (physician) and Advanced Practice Providers (APPs -  Physician Assistants and Nurse Practitioners) who all work together to provide you with the care you need, when you  need it.  Your next appointment:   4 week(s) after your ablation  The format for your next appointment:   In Person  Provider:   You will follow up in the [facility] located at [facility]. Your provider will be: Roderic Palau, NP or Clint R. Fenton, PA-C    You are scheduled to see Dr. Curt Bears July 11th @ 10:45 am in the [facility] [place] office,  to schedule ablation for September.     Thank you for choosing [facility]!!   Trinidad Curet, RN [phone]   Other Instructions  CT INSTRUCTIONS Your cardiac CT will be scheduled at:  [facility] [address] [place], [place] [address] [phone]  Please arrive at the [facility] main entrance of [facility] 30 minutes prior to test start time. Proceed to the [facility] Radiology Department (first floor) to check-in and test prep.  Please follow these instructions carefully (unless otherwise directed):  Hold all erectile dysfunction medications at least 3 days (72 hrs) prior to test.  On the Night Before the Test: . Be sure to Drink plenty of water. . Do not consume any caffeinated/decaffeinated beverages or chocolate 12 hours prior to your test. . Do not take any antihistamines 12 hours prior to your test. . If the patient has contrast allergy: ? Patient will need a prescription for Prednisone and very clear instructions (as follows): 1. Prednisone 50 mg - take 13 hours prior to test 2. Take another Prednisone 50 mg 7 hours prior to test 3. Take another Prednisone 50 mg 1 hour prior to test 4.  Take Benadryl 50 mg 1 hour prior to test . Patient must complete all four doses of above prophylactic medications. . Patient will need a ride after test due to Benadryl.  On the Day of the Test: . Drink plenty of water. Do not drink any water within one hour of the test. . Do not eat any food 4 hours prior to the test. . You may take your regular medications prior to the test.   . Take metoprolol (Lopressor) two hours prior to test. . HOLD Furosemide/Hydrochlorothiazide morning of the test. . FEMALES- please wear underwire-free bra if available   *For Clinical Staff only. Please instruct patient the following:*        -Drink plenty of water       -Hold Furosemide/hydrochlorothiazide morning of the test       -Take metoprolol (Lopressor) 2 hours prior to test (if applicable).                  -If HR is less than 55 BPM- No Lopressor                -IF HR is greater than 55 BPM and patient is less than or equal to 36 yrs old Lopressor 157m x1.                -If HR is greater than 55 BPM and patient is greater than 766yrs old Lopressor 50 mg x1.     Do not give Lopressor to patients with an allergy to lopressor or anyone with asthma or active COPD symptoms (currently taking steroids).       After the Test: . Drink plenty of water. . After receiving IV contrast, you may experience a mild flushed feeling. This is normal. . On occasion, you may experience a mild rash up to 24 hours after the test. This is not dangerous. If this occurs, you can take Benadryl 25 mg and increase your fluid intake. . If you experience trouble breathing, this can be serious. If it is severe call 911 IMMEDIATELY. If it is mild, please call our office. . If you take any of these medications: Glipizide/Metformin, Avandament, Glucavance, please do not take 48 hours after completing test unless otherwise instructed.   Once we have confirmed authorization from your insurance company, we will call you to set up a date and time for your test. Based on how quickly your insurance processes prior authorizations requests, please allow up to 4 weeks to be contacted for scheduling your Cardiac CT appointment. Be advised that routine Cardiac CT appointments could be scheduled as many as 8 weeks after your provider has ordered it.  For non-scheduling related questions, please contact the cardiac  imaging nurse navigator should you have any questions/concerns: SMarchia Bond Cardiac Imaging Nurse Navigator MBurley Saver Interim Cardiac Imaging Nurse [facility] Direct Office Dial: [phone]  For scheduling needs, including cancellations and rescheduling, please call [place] [phone]     Electrophysiology/Ablation Procedure Instructions   You are scheduled for a(n)  ablation on _____________ with Dr. WAllegra Lai   1.   Pre procedure testing-             A.  LAB WORK --- On _______________  for your pre procedure blood work.  You do NOT need to be fasting.               B. COVID TEST-- On ____________ @ ___________ - This is a  Drive Up Visit at [address] [place], [place] [address].  Someone will direct you to the appropriate testing line. Stay in your car and someone will be with you shortly.   After you are tested please go home and self quarantine until the day of your procedure.     PROCEDURE DAY: 2. On the day of your procedure ____________- you will go to [facility] [phone] [address]) at _____________.  You will go to the main entrance [address] [employer]) and enter where the [employer] parking staff are.  Your driver will drop you off and you will head down the hallway to ADMITTING.  You may have one support person come in to the hospital with you.  They will be asked to wait in the waiting room.  It is OK to have someone drop you off and come back when you are ready to be discharged.   3.   Do not eat or drink after midnight prior to your procedure.   4.   Do not miss any doses of your blood thinner prior to the morning of your procedure or your procedure will need to be rescheduled.       Do NOT take any medications the morning of your procedure.   5.  Plan for an overnight stay, but you may be discharged home after your procedure. If you use your phone frequently bring your phone charger, in case you have to stay.  If you  are discharged after your procedure you will need someone to drive you home and be with your for 24 hours after your procedure.   6. You will follow up with the AFIB clinic 4 weeks after your procedure.  You will follow up with Dr. Curt Bears  3 months after your procedure.  These appointments will be made for you.   * If you have ANY questions please call the office [phone] and ask for Chamara Dyck RN or send me a MyChart message   * Occasionally, EP Studies and ablations can become lengthy.  Please make your family aware of this before your procedure starts.  Average time ranges from 2-8 hours for EP studies/ablations.  Your physician will call your family after the procedure with the results.                                    Cardiac Ablation Cardiac ablation is a procedure to destroy (ablate) some heart tissue that is sending bad signals. These bad signals cause problems in heart rhythm. The heart has many areas that make these signals. If there are problems in these areas, they can make the heart beat in a way that is not normal. Destroying some tissues can help make the heart rhythm normal. Tell your doctor about:  Any allergies you have.  All medicines you are taking. These include vitamins, herbs, eye drops, creams, and over-the-counter medicines.  Any problems you or family members have had with medicines that make you fall asleep (anesthetics).  Any blood disorders you have.  Any surgeries you have had.  Any medical conditions you have, such as kidney failure.  Whether you are pregnant or may be pregnant. What are the risks? This is a safe procedure. But problems may occur, including:  Infection.  Bruising and bleeding.  Bleeding into the chest.  Stroke or blood clots.  Damage to nearby areas of your body.  Allergies to medicines or dyes.  The need for a pacemaker if the normal system is damaged.  Failure of the procedure to treat the problem. What happens  before the procedure? Medicines Ask your doctor about:  Changing or stopping your normal medicines. This is important.  Taking aspirin and ibuprofen. Do not take these medicines unless your doctor tells you to take them.  Taking other medicines, vitamins, herbs, and supplements. General instructions  Follow instructions from your doctor about what you cannot eat or drink.  Plan to have someone take you home from the hospital or clinic.  If you will be going home right after the procedure, plan to have someone with you for 24 hours.  Ask your doctor what steps will be taken to prevent infection. What happens during the procedure?  An IV tube will be put into one of your veins.  You will be given a medicine to help you relax.  The skin on your neck or groin will be numbed.  A cut (incision) will be made in your neck or groin. A needle will be put through your cut and into a large vein.  A tube (catheter) will be put into the needle. The tube will be moved to your heart.  Dye may be put through the tube. This helps your doctor see your heart.  Small devices (electrodes) on the tube will send out signals.  A type of energy will be used to destroy some heart tissue.  The tube will be taken out.  Pressure will be held on your cut. This helps stop bleeding.  A bandage will be put over your cut. The exact procedure may vary among doctors and hospitals.   What happens after the procedure?  You will be watched until you leave the hospital or clinic. This includes checking your heart rate, breathing rate, oxygen, and blood pressure.  Your cut will be watched for bleeding. You will need to lie still for a few hours.  Do not drive for 24 hours or as long as your doctor tells you. Summary  Cardiac ablation is a procedure to destroy some heart tissue. This is done to treat heart rhythm problems.  Tell your doctor about any medical conditions you may have. Tell him or her about  all medicines you are taking to treat them.  This is a safe procedure. But problems may occur. These include infection, bruising, bleeding, and damage to nearby areas of your body.  Follow what your doctor tells you about food and drink. You may also be told to change or stop some of your medicines.  After the procedure, do not drive for 24 hours or as long as your doctor tells you. This information is not intended to replace advice given to you by your health care provider. Make sure you discuss any questions you have with your health care provider. Document Revised: 04/27/2019 Document Reviewed: 04/27/2019 Elsevier Patient Education  2021 [employer].

## 2020-09-02 NOTE — Progress Notes (Signed)
Electrophysiology Office Note   Date:  09/02/2020   ID:  Michelle Perkins, DOB [birth year], MRN [id number]  PCP:  Philemon Kingdom, MD  Cardiologist:  Bing Matter Primary Electrophysiologist:  Will Jorja Loa, MD    Chief Complaint: AF   History of Present Illness: Michelle Perkins is a 60 y.o. female who is being seen today for the evaluation of AF at the request of Philemon Kingdom, MD. Presenting today for electrophysiology evaluation.  She has a history significant for paroxysmal atrial fibrillation.  She is currently on propafenone.  She has had a few cardioversions in the past.  Since starting her propafenone, she has done well and has not required cardioversion.  Today, denies symptoms of palpitations, chest pain, shortness of breath, orthopnea, PND, lower extremity edema, claudication, dizziness, presyncope, syncope, bleeding, or neurologic sequela. The patient is tolerating medications without difficulties.  Unfortunately she has gone into atrial flutter.  He says that she can hear her heart rate and has some mild fatigue, but otherwise feels well.  She would at this point prefer ablation.  She would like it to be done towards the end of the summer.   Past Medical History:  Diagnosis Date  . Dyspnea on exertion 06/06/2018  . Palpitations 06/06/2018  . Paroxysmal atrial fibrillation (HCC) 06/06/2018  . Persistent atrial fibrillation (HCC) 07/24/2019  . Snoring 11/07/2019   Past Surgical History:  Procedure Laterality Date  . APPENDECTOMY       Current Outpatient Medications  Medication Sig Dispense Refill  . CARTIA XT 180 MG 24 hr capsule TAKE 1 CAPSULE BY MOUTH  DAILY 90 capsule 3  . diltiazem (CARDIZEM) 30 MG tablet Take 30 mg by mouth as needed (tachycardia). If heart rate is over 120    . ELIQUIS 5 MG TABS tablet TAKE 1 TABLET BY MOUTH  TWICE DAILY 180 tablet 1  . metoprolol succinate (TOPROL-XL) 50 MG 24 hr tablet Take 1 tablet (50 mg total) by mouth daily. 30  tablet 1  . propafenone (RYTHMOL SR) 225 MG 12 hr capsule TAKE 1 CAPSULE BY MOUTH  TWICE DAILY 180 capsule 3   No current facility-administered medications for this visit.    Allergies:   Codeine, Levaquin [levofloxacin], Lidocaine, and Penicillins   Social History:  The patient  reports that she has never smoked. She has never used smokeless tobacco. She reports that she does not drink alcohol and does not use drugs.   Family History:  The patient's family history includes Atrial fibrillation in her brother, father, mother, and sister; Heart failure in her father; Lung cancer in her mother.   ROS:  Please see the history of present illness.   Otherwise, review of systems is positive for none.   All other systems are reviewed and negative.   PHYSICAL EXAM: VS:  BP 126/68   Pulse (!) 106   Ht 5\' 8"  (1.727 m)   Wt 224 lb (101.6 kg)   BMI 34.06 kg/m  , BMI Body mass index is 34.06 kg/m. GEN: Well nourished, well developed, in no acute distress  HEENT: normal  Neck: no JVD, carotid bruits, or masses Cardiac: Irregular; no murmurs, rubs, or gallops,no edema  Respiratory:  clear to auscultation bilaterally, normal work of breathing GI: soft, nontender, nondistended, + BS MS: no deformity or atrophy  Skin: warm and dry Neuro:  Strength and sensation are intact Psych: euthymic mood, full affect  EKG:  EKG is not ordered today. Personal review of the ekg ordered  08/27/20 shows atrial flutter, rate 61  Recent Labs: No results found for requested labs within last 8760 hours.    Lipid Panel  No results found for: CHOL, TRIG, HDL, CHOLHDL, VLDL, LDLCALC, LDLDIRECT   Wt Readings from Last 3 Encounters:  09/02/20 224 lb (101.6 kg)  08/27/20 225 lb (102.1 kg)  06/12/20 220 lb (99.8 kg)      Other studies Reviewed: Additional studies/ records that were reviewed today include: TTE 06/10/2018 Review of the above records today demonstrates:  Ejection fraction 55 to 60% Right  ventricular normal size and function Mild aortic sclerosis Trace mitral regurgitation Left atrium normal size   ASSESSMENT AND PLAN:  1.  Paroxysmal atrial fibrillation/atrial flutter: Currently on propafenone,, Eliquis, diltiazem, metoprolol.  High risk medication monitoring.  CHA2DS2-VASc of 1.  She feels like ablation would be reasonable, but she does have quite a bit going on over the next few months.  She would like to hold off until after the summer.  We will bring her back in July for further discussions.  Case discussed with primary cardiology  Current medicines are reviewed at length with the patient today.   The patient does not have concerns regarding her medicines.  The following changes were made today: None  Labs/ tests ordered today include:  No orders of the defined types were placed in this encounter.    Disposition:   FU with Will Camnitz 3 months  Signed, Will Jorja Loa, MD  09/02/2020 2:47 PM     [facility] [address] [place] [address] [phone] (office) [phone] (fax)

## 2020-09-05 ENCOUNTER — Telehealth: Payer: Self-pay | Admitting: Emergency Medicine

## 2020-09-05 NOTE — Telephone Encounter (Signed)
Left message for patient to return call regarding cardioversion appt. She is scheduled at [facility] for April 11th at 8am will need to arrive at 615 am

## 2020-09-06 MED ORDER — METOPROLOL SUCCINATE ER 50 MG PO TB24: 50.0000 mg | ORAL_TABLET | Freq: Every day | ORAL | 1 refills | Status: DC

## 2020-09-06 NOTE — Telephone Encounter (Signed)
Called spoke to patient informed her of date and time of cardioversion. She will have labs drawn next week, she also asked me to send her metoprolol to [facility] I did this. No further questions.

## 2020-09-06 NOTE — Addendum Note (Signed)
Addended by: Hazle Quant on: 09/06/2020 12:15 PM   Modules accepted: Orders

## 2020-09-12 ENCOUNTER — Telehealth: Payer: Self-pay | Admitting: Cardiology

## 2020-09-12 NOTE — Telephone Encounter (Signed)
Called patient informed her Dr. Bing Matter did not fill out form in [place] for labs. We are in [place] not so I will have to fill out another form for him to sign. She understood.

## 2020-09-12 NOTE — Telephone Encounter (Signed)
Order has been faxed. Left message informing patient.

## 2020-09-12 NOTE — Telephone Encounter (Signed)
    Pt requesting to speak with [facility], she wanted to make sure her lab work orders fax to [facility] so she can get her labs there

## 2020-09-14 ENCOUNTER — Other Ambulatory Visit: Payer: Self-pay | Admitting: Cardiology

## 2020-09-16 DIAGNOSIS — I4891 Unspecified atrial fibrillation: Secondary | ICD-10-CM | POA: Diagnosis not present

## 2020-09-16 NOTE — Telephone Encounter (Signed)
Propafenone approved and sent

## 2020-10-13 ENCOUNTER — Other Ambulatory Visit: Payer: Self-pay | Admitting: Cardiology

## 2020-11-22 ENCOUNTER — Other Ambulatory Visit: Payer: Self-pay | Admitting: Cardiology

## 2020-11-25 NOTE — Telephone Encounter (Signed)
Prescription refill request for Eliquis received. Indication:atrial fib Last office visit:3/22 Scr:0.8 Age: 60 Weight:101.6 kg  Prescription refilled

## 2020-12-11 ENCOUNTER — Ambulatory Visit: Payer: Commercial Managed Care - PPO | Admitting: Cardiology

## 2020-12-11 ENCOUNTER — Other Ambulatory Visit: Payer: Self-pay

## 2020-12-11 ENCOUNTER — Encounter: Payer: Self-pay | Admitting: Cardiology

## 2020-12-11 VITALS — BP 114/78 | HR 60 | Ht 68.0 in | Wt 228.4 lb

## 2020-12-11 DIAGNOSIS — R002 Palpitations: Secondary | ICD-10-CM | POA: Diagnosis not present

## 2020-12-11 DIAGNOSIS — R0609 Other forms of dyspnea: Secondary | ICD-10-CM

## 2020-12-11 DIAGNOSIS — R0600 Dyspnea, unspecified: Secondary | ICD-10-CM | POA: Diagnosis not present

## 2020-12-11 DIAGNOSIS — I4892 Unspecified atrial flutter: Secondary | ICD-10-CM

## 2020-12-11 DIAGNOSIS — I480 Paroxysmal atrial fibrillation: Secondary | ICD-10-CM | POA: Diagnosis not present

## 2020-12-11 NOTE — Patient Instructions (Signed)

## 2020-12-11 NOTE — Progress Notes (Signed)
Cardiology Office Note:    Date:  12/11/2020   ID:  Michelle Perkins, DOB 1961/03/14, MRN [id number]  PCP:  Philemon Kingdom, MD  Cardiologist:  Gypsy Balsam, MD    Referring MD: Philemon Kingdom, MD   Chief Complaint  Patient presents with   Atrial Fibrillation    History of Present Illness:    Michelle Perkins is a 60 y.o. female flex past medical history she does have history of paroxysmal atrial flutter she has been on propafenone she required 2 cardioversions so far last cardioversion was done in April as she stay within normal rhythm for about a month and she flipped to what appears to be atrial flutter at this time she is coming today to my office to talk about options she does not feel well when she got atrial flutter she is a nurse working in the hospital and she is still able to perform her activities of daily living but the irregularity of her heartbeat and tachycardia bothers her.  She did see our EP colleagues and there was discussion about potentially atrial fibrillation ablation we did revisit that issue today and she is more willing to proceed that way.  Today she got atrial flutter therefore atrial flutter as well as atrial fibrillation ablation need to be considered.  She does have appoint with Dr. Elberta Fortis on 11th of this month which is just in the next few days.  She denies have any chest pain tightness squeezing pressure burning chest.  Recently she was discovered to have some esophageal problem.  She does have what appears to be esophageal strictures.  Past Medical History:  Diagnosis Date   Dyspnea on exertion 06/06/2018   Palpitations 06/06/2018   Paroxysmal atrial fibrillation (HCC) 06/06/2018   Persistent atrial fibrillation (HCC) 07/24/2019   Snoring 11/07/2019    Past Surgical History:  Procedure Laterality Date   APPENDECTOMY      Current Medications: Current Meds  Medication Sig   apixaban (ELIQUIS) 5 MG TABS tablet TAKE 1 TABLET BY MOUTH  TWICE DAILY  (Patient taking differently: Take 5 mg by mouth 2 (two) times daily. TAKE 1 TABLET BY MOUTH  TWICE DAILY)   CARTIA XT 180 MG 24 hr capsule TAKE 1 CAPSULE BY MOUTH  DAILY (Patient taking differently: Take 180 mg by mouth daily.)   diltiazem (CARDIZEM) 30 MG tablet Take 30 mg by mouth as needed (tachycardia). If heart rate is over 120   metoprolol succinate (TOPROL-XL) 50 MG 24 hr tablet Take 1 tablet (50 mg total) by mouth daily.     Allergies:   Codeine, Levaquin [levofloxacin], Lidocaine, and Penicillins   Social History   Socioeconomic History   Marital status: Married    Spouse name: Not on file   Number of children: Not on file   Years of education: Not on file   Highest education level: Not on file  Occupational History   Not on file  Tobacco Use   Smoking status: Never   Smokeless tobacco: Never  Vaping Use   Vaping Use: Never used  Substance and Sexual Activity   Alcohol use: Never   Drug use: Never   Sexual activity: Not on file  Other Topics Concern   Not on file  Social History Narrative   Not on file   Social Determinants of Health   Financial Resource Strain: Not on file  Food Insecurity: Not on file  Transportation Needs: Not on file  Physical Activity: Not on file  Stress:  Not on file  Social Connections: Not on file     Family History: The patient's family history includes Atrial fibrillation in her brother, father, mother, and sister; Heart failure in her father; Lung cancer in her mother. ROS:   Please see the history of present illness.    All 14 point review of systems negative except as described per history of present illness  EKGs/Labs/Other Studies Reviewed:      Recent Labs: No results found for requested labs within last 8760 hours.  Recent Lipid Panel No results found for: CHOL, TRIG, HDL, CHOLHDL, VLDL, LDLCALC, LDLDIRECT  Physical Exam:    VS:  BP 114/78 (BP Location: Left Arm, Patient Position: Sitting)   Pulse 60   Ht 5\' 8"   (1.727 m)   Wt 228 lb 6.4 oz (103.6 kg)   SpO2 97%   BMI 34.73 kg/m     Wt Readings from Last 3 Encounters:  12/11/20 228 lb 6.4 oz (103.6 kg)  09/02/20 224 lb (101.6 kg)  08/27/20 225 lb (102.1 kg)     GEN:  Well nourished, well developed in no acute distress HEENT: Normal NECK: No JVD; No carotid bruits LYMPHATICS: No lymphadenopathy CARDIAC: RRR, no murmurs, no rubs, no gallops RESPIRATORY:  Clear to auscultation without rales, wheezing or rhonchi  ABDOMEN: Soft, non-tender, non-distended MUSCULOSKELETAL:  No edema; No deformity  SKIN: Warm and dry LOWER EXTREMITIES: no swelling NEUROLOGIC:  Alert and oriented x 3 PSYCHIATRIC:  Normal affect   ASSESSMENT:    1. Paroxysmal atrial flutter (HCC)   2. Paroxysmal atrial fibrillation (HCC)   3. Dyspnea on exertion   4. Palpitations    PLAN:    In order of problems listed above:  Paroxysmal atrial flutter today she is in atrial flutter.  She is on propafenone.  She is scheduled to see EP team for consideration of more advanced management.  That probably will involve doing atrial flutter/atrial fibrillation ablation which I think would reach the point that that this needed.  She does not want to do it right away she does have some social engagements over the summer and she prefers to do it after that therefore anticipate need to cardiovert her back to normal rhythm from her atrial flutter.  I will leave this decision up to our EP team. Dyspnea on exertion seems to be doing well from that point review continue present management. Paroxysmal atrial fibrillation.  She is in atrial flutter today Overall it is quite frustrating situation she does have recurrences of atrial fibrillation now atrial flutter.  I think we reached the point of atrial fibrillation atrial flutter ablation need to be seriously considered.   Medication Adjustments/Labs and Tests Ordered: Current medicines are reviewed at length with the patient today.   Concerns regarding medicines are outlined above.  Orders Placed This Encounter  Procedures   EKG 12-Lead   Medication changes: No orders of the defined types were placed in this encounter.   Signed, 04-03-1976, MD, [facility] 12/11/2020 8:58 AM    [facility]

## 2020-12-16 ENCOUNTER — Ambulatory Visit: Payer: Commercial Managed Care - PPO | Admitting: Cardiology

## 2020-12-16 ENCOUNTER — Other Ambulatory Visit: Payer: Self-pay

## 2020-12-16 ENCOUNTER — Encounter: Payer: Self-pay | Admitting: Cardiology

## 2020-12-16 VITALS — BP 138/83 | HR 58 | Ht 68.0 in | Wt 228.6 lb

## 2020-12-16 DIAGNOSIS — I483 Typical atrial flutter: Secondary | ICD-10-CM | POA: Diagnosis not present

## 2020-12-16 NOTE — Patient Instructions (Signed)
Medication Instructions:  Your physician recommends that you continue on your current medications as directed. Please refer to the Current Medication list given to you today.  *If you need a refill on your cardiac medications before your next appointment, please call your pharmacy*   Lab Work: Pre procedure labs: ______________ If you have labs (blood work) drawn today and your tests are completely normal, you will receive your results only by: MyChart Message (if you have MyChart) OR A paper copy in the mail If you have any lab test that is abnormal or we need to change your treatment, we will call you to review the results.   Testing/Procedures: Your physician has recommended that you have an ablation. Catheter ablation is a medical procedure used to treat some cardiac arrhythmias (irregular heartbeats). During catheter ablation, a long, thin, flexible tube is put into a blood vessel in your groin (upper thigh), or neck. This tube is called an ablation catheter. It is then guided to your heart through the blood vessel. Radio frequency waves destroy small areas of heart tissue where abnormal heartbeats may cause an arrhythmia to start. Please see the instruction sheet below    Follow-Up: At [facility], you and your health needs are our priority.  As part of our continuing mission to provide you with exceptional heart care, we have created designated Provider Care Teams.  These Care Teams include your primary Cardiologist (physician) and Advanced Practice Providers (APPs -  Physician Assistants and Nurse Practitioners) who all work together to provide you with the care you need, when you need it.  We recommend signing up for the patient portal called "MyChart".  Sign up information is provided on this After Visit Summary.  MyChart is used to connect with patients for Virtual Visits (Telemedicine).  Patients are able to view lab/test results, encounter notes, upcoming appointments, etc.   Non-urgent messages can be sent to your provider as well.   To learn more about what you can do with MyChart, go to [email or web address].    Your next appointment:     to be determined  The format for your next appointment:   Virtual Visit   Provider:   Loman Brooklyn, MD    Thank you for choosing [facility]!!   Dory Horn, RN [phone]   Other Instructions    Electrophysiology/Ablation Procedure Instructions   You are scheduled for a(n)  ablation on __________ with Dr. Loman Brooklyn.   1.   Pre procedure testing-             A.  LAB WORK --- On ___________  for your pre procedure blood work.  You do NOT need to be fasting.    PROCEDURE DAY: On the day of your procedure 04/09/2021 you will go to [facility] [phone]) at _____________.  You will go to the main entrance [address] [employer]) and enter where the [employer] parking staff are.  Your driver will drop you off and you will head down the hallway to ADMITTING.  You may have one support person come in to the hospital with you.  They will be asked to wait in the waiting room.  It is OK to have someone drop you off and come back when you are ready to be discharged.   3.   Do not eat or drink after midnight prior to your procedure.   4.   Do not miss any doses of your blood thinner prior to the morning  of your procedure or your procedure will need to be rescheduled.       Do NOT take any medications the morning of your procedure.   5.  Plan for an overnight stay, but you may be discharged home after your procedure. If you use your phone frequently bring your phone charger, in case you have to stay.  If you are discharged after your procedure you will need someone to drive you home and be with your for 24 hours after your procedure.   6. You will follow up with the AFIB clinic 4 weeks after your procedure.  You will follow up with Dr. Elberta Fortis  3 months after your procedure.  These appointments will be  made for you.  7. FYI: For your safety, and to allow [place] to monitor your vital signs accurately during the surgery/procedure we request that if you have artificial nails, gel coating, SNS etc. Please have those removed prior to your surgery/procedure. Not having the nail coverings /polish removed may result in cancellation or delay of your surgery/procedure.   * If you have ANY questions please call the office [phone] and ask for Larie Mathes RN or send me a MyChart message   * Occasionally, EP Studies and ablations can become lengthy.  Please make your family aware of this before your procedure starts.  Average time ranges from 2-8 hours for EP studies/ablations.  Your physician will call your family after the procedure with the results.

## 2020-12-16 NOTE — Progress Notes (Signed)
Electrophysiology Office Note   Date:  12/16/2020   ID:  Michelle Perkins, DOB Jan 23, 1961, MRN [id number]  PCP:  Philemon Kingdom, MD  Cardiologist:  Bing Matter Primary Electrophysiologist:  Michelle Awadallah Jorja Loa, MD    Chief Complaint: AF   History of Present Illness: Michelle Perkins is a 60 y.o. female who is being seen today for the evaluation of AF at the request of Philemon Kingdom, MD. Presenting today for electrophysiology evaluation.  She has a history significant for paroxysmal atrial fibrillation, atrial flutter.  She is currently on propafenone.  She has had multiple cardioversions in the past.  Since starting propafenone, she had done quite well, though she has gone into atrial flutter.  She has symptoms of fatigue.  Today, denies symptoms of palpitations, chest pain, shortness of breath, orthopnea, PND, lower extremity edema, claudication, dizziness, presyncope, syncope, bleeding, or neurologic sequela. The patient is tolerating medications without difficulties.  Since being seen she has done well.  She has minimal shortness of breath and fatigue.  Despite that, she remains in atrial flutter.  She would like to get back into normal rhythm if possible.  She would like to have an ablation, though October would be best for her.   Past Medical History:  Diagnosis Date   Dyspnea on exertion 06/06/2018   Palpitations 06/06/2018   Paroxysmal atrial fibrillation (HCC) 06/06/2018   Persistent atrial fibrillation (HCC) 07/24/2019   Snoring 11/07/2019   Past Surgical History:  Procedure Laterality Date   APPENDECTOMY       Current Outpatient Medications  Medication Sig Dispense Refill   apixaban (ELIQUIS) 5 MG TABS tablet TAKE 1 TABLET BY MOUTH  TWICE DAILY (Patient taking differently: Take 5 mg by mouth 2 (two) times daily. TAKE 1 TABLET BY MOUTH  TWICE DAILY) 180 tablet 3   CARTIA XT 180 MG 24 hr capsule TAKE 1 CAPSULE BY MOUTH  DAILY (Patient taking differently: Take 180 mg by  mouth daily.) 90 capsule 3   diltiazem (CARDIZEM) 30 MG tablet Take 30 mg by mouth as needed (tachycardia). If heart rate is over 120     metoprolol succinate (TOPROL-XL) 50 MG 24 hr tablet Take 1 tablet (50 mg total) by mouth daily. 90 tablet 1   propafenone (RYTHMOL SR) 225 MG 12 hr capsule TAKE 1 CAPSULE BY MOUTH  TWICE DAILY (Patient taking differently: Take 225 mg by mouth 2 (two) times daily.) 180 capsule 3   No current facility-administered medications for this visit.    Allergies:   Codeine, Levaquin [levofloxacin], Lidocaine, and Penicillins   Social History:  The patient  reports that she has never smoked. She has never used smokeless tobacco. She reports that she does not drink alcohol and does not use drugs.   Family History:  The patient's family history includes Atrial fibrillation in her brother, father, mother, and sister; Heart failure in her father; Lung cancer in her mother.   ROS:  Please see the history of present illness.   Otherwise, review of systems is positive for none.   All other systems are reviewed and negative.   PHYSICAL EXAM: VS:  BP 138/83   Pulse (!) 58   Ht 5\' 8"  (1.727 m)   Wt 228 lb 9.6 oz (103.7 kg)   SpO2 95%   BMI 34.76 kg/m  , BMI Body mass index is 34.76 kg/m. GEN: Well nourished, well developed, in no acute distress  HEENT: normal  Neck: no JVD, carotid bruits, or masses Cardiac:  irregular; no murmurs, rubs, or gallops,no edema  Respiratory:  clear to auscultation bilaterally, normal work of breathing GI: soft, nontender, nondistended, + BS MS: no deformity or atrophy  Skin: warm and dry Neuro:  Strength and sensation are intact Psych: euthymic mood, full affect  EKG:  EKG is not ordered today. Personal review of the ekg ordered 12/11/20 shows atrial flutter, rate 67  Recent Labs: No results found for requested labs within last 8760 hours.    Lipid Panel  No results found for: CHOL, TRIG, HDL, CHOLHDL, VLDL, LDLCALC,  LDLDIRECT   Wt Readings from Last 3 Encounters:  12/16/20 228 lb 9.6 oz (103.7 kg)  12/11/20 228 lb 6.4 oz (103.6 kg)  09/02/20 224 lb (101.6 kg)      Other studies Reviewed: Additional studies/ records that were reviewed today include: TTE 06/10/2018 Review of the above records today demonstrates:  Ejection fraction 55 to 60% Right ventricular normal size and function Mild aortic sclerosis Trace mitral regurgitation Left atrium normal size   ASSESSMENT AND PLAN:  1.  Paroxysmal atrial fibrillation/flutter: Currently on propafenone, Eliquis, diltiazem, metoprolol.  CHA2DS2-VASc of 1.  High risk medication monitoring.  She is in atrial flutter today.  She would like to hold off on cardioversion if possible until ablation.  Risk, benefits, and alternatives to EP study and radiofrequency ablation for afib were also discussed in detail today. These risks include but are not limited to stroke, bleeding, vascular damage, tamponade, perforation, damage to the esophagus, lungs, and other structures, pulmonary vein stenosis, worsening renal function, and death. The patient understands these risk and wishes to proceed.  We Michelle Perkins therefore proceed with catheter ablation at the next available time.  Carto, ICE, anesthesia are requested for the procedure.  Michelle Perkins also obtain CT PV protocol prior to the procedure to exclude LAA thrombus and further evaluate atrial anatomy.  Case discussed with primary cardiology  Current medicines are reviewed at length with the patient today.   The patient does not have concerns regarding her medicines.  The following changes were made today: none  Labs/ tests ordered today include:  Orders Placed This Encounter  Procedures   EKG 12-Lead      Disposition:   FU with Michelle Perkins 6 months  Signed, Michelle Kang Jorja Loa, MD  12/16/2020 11:17 AM     [facility] [address] [place] [address] [phone] (office) [phone]  (fax)

## 2020-12-25 ENCOUNTER — Encounter: Payer: Self-pay | Admitting: Physician Assistant

## 2021-02-20 ENCOUNTER — Ambulatory Visit: Payer: Commercial Managed Care - PPO | Admitting: Physician Assistant

## 2021-03-05 ENCOUNTER — Ambulatory Visit (INDEPENDENT_AMBULATORY_CARE_PROVIDER_SITE_OTHER): Payer: Commercial Managed Care - PPO | Admitting: Gastroenterology

## 2021-03-05 ENCOUNTER — Telehealth: Payer: Self-pay

## 2021-03-05 ENCOUNTER — Other Ambulatory Visit: Payer: Self-pay

## 2021-03-05 ENCOUNTER — Encounter: Payer: Self-pay | Admitting: Gastroenterology

## 2021-03-05 VITALS — BP 160/90 | HR 107 | Ht 68.0 in | Wt 229.5 lb

## 2021-03-05 DIAGNOSIS — K219 Gastro-esophageal reflux disease without esophagitis: Secondary | ICD-10-CM | POA: Diagnosis not present

## 2021-03-05 MED ORDER — PANTOPRAZOLE SODIUM 40 MG PO TBEC: 40.0000 mg | DELAYED_RELEASE_TABLET | Freq: Every day | ORAL | 3 refills | Status: DC

## 2021-03-05 NOTE — Progress Notes (Signed)
Chief Complaint: Dysphagia  Referring Provider:  Philemon Kingdom, MD      ASSESSMENT AND PLAN;   #1. GERD  #2. Esophageal dysphagia. D/d includes eso stricture, Schatzki's ring, motility disorder, EoE, pill induced esophagitis, r/o esophageal Ca or extrinsic lesions.   Plan: -Records from [facility] regarding MBS. -Protonix 40mg  po QD #30, 11 refills -EGD with dil after cardiology clearance from Dr and hold eliquis x 24hrs before.  I have discussed the risks and benefits. The risks including rare risk of perforation, bleeding, missed UGI neoplasms, risks of anesthesia/sedation. Alternatives were given. Patient is aware and agrees to proceed. All the questions were answered. This will be scheduled in upcoming days. Consent forms were given for review.  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  No melena or hematochezia.  No weight loss.  When the episodes to occur, she cannot handle her own saliva.  Denies having any cough.  Does get some shortness of breath with episodes.  Underwent MBS at [facility]- we do not have records.  However, was told that was neg. She did not have traditional barium swallow.  Denies having any fever chills or night sweats.  Has gained weight ever since diagnosed with A. fib.  Previous GI work-up  Cologuard 2022- neg. Wanted to avoid colonoscopy.  CT AP 04/2017 with contrast: Neg for acute abn.   Past Medical History:  Diagnosis Date   Dyspnea on exertion 06/06/2018   History of anemia    Palpitations 06/06/2018   Paroxysmal atrial fibrillation (HCC) 06/06/2018   Persistent atrial fibrillation (HCC) 07/24/2019   Snoring 11/07/2019   Vitamin D deficiency     Past Surgical History:  Procedure Laterality Date   APPENDECTOMY      Family History   Problem Relation Age of Onset   Atrial fibrillation Mother    Lung cancer Mother    Atrial fibrillation Father    Heart failure Father    Atrial fibrillation Sister    Atrial fibrillation Brother    Colon cancer Neg Hx    Pancreatic cancer Neg Hx    Esophageal cancer Neg Hx    Liver disease Neg Hx    Rectal cancer Neg Hx     Social History   Tobacco Use   Smoking status: Never   Smokeless tobacco: Never  Vaping Use   Vaping Use: Never used  Substance Use Topics   Alcohol use: Never   Drug use: Never    Current Outpatient Medications  Medication Sig Dispense Refill   apixaban (ELIQUIS) 5 MG TABS tablet TAKE 1 TABLET BY MOUTH  TWICE DAILY (Patient taking differently: Take 5 mg by mouth 2 (two) times daily. TAKE 1 TABLET BY MOUTH  TWICE DAILY) 180 tablet 3   CARTIA XT 180 MG 24 hr capsule TAKE 1 CAPSULE BY MOUTH  DAILY (Patient taking differently: Take 180 mg by mouth at bedtime.) 90 capsule 3   diltiazem (CARDIZEM) 30 MG tablet Take 30 mg by mouth as needed (tachycardia). If heart rate is over 120     metoprolol succinate (TOPROL-XL) 50 MG 24 hr tablet Take 1 tablet (50 mg total) by mouth daily. (Patient taking differently: Take 50 mg by mouth at bedtime.) 90 tablet 1   propafenone (RYTHMOL SR) 225 MG 12  hr capsule TAKE 1 CAPSULE BY MOUTH  TWICE DAILY (Patient taking differently: Take 225 mg by mouth 2 (two) times daily.) 180 capsule 3   No current facility-administered medications for this visit.    Allergies  Allergen Reactions   Codeine    Levaquin [Levofloxacin]    Lidocaine    Penicillins     Review of Systems:  Constitutional: Denies fever, chills, diaphoresis, appetite change and fatigue.  HEENT: Denies photophobia, eye pain, redness, hearing loss, ear pain, congestion, sore throat, rhinorrhea, sneezing, mouth sores, neck pain, neck stiffness and tinnitus.   Respiratory: Denies SOB, DOE, cough, chest tightness,  and wheezing.   Cardiovascular: Denies chest  pain, palpitations and leg swelling.  Genitourinary: Denies dysuria, urgency, frequency, hematuria, flank pain and difficulty urinating.  Musculoskeletal: Denies myalgias, back pain, joint swelling, arthralgias and gait problem.  Skin: No rash.  Neurological: Denies dizziness, seizures, syncope, weakness, light-headedness, numbness and headaches.  Hematological: Denies adenopathy. Easy bruising, personal or family bleeding history  Psychiatric/Behavioral: No anxiety or depression     Physical Exam:    BP (!) 160/90   Pulse (!) 107   Ht 5\' 8"  (1.727 m)   Wt 229 lb 8 oz (104.1 kg)   SpO2 98%   BMI 34.90 kg/m  Wt Readings from Last 3 Encounters:  03/05/21 229 lb 8 oz (104.1 kg)  12/16/20 228 lb 9.6 oz (103.7 kg)  12/11/20 228 lb 6.4 oz (103.6 kg)   Constitutional:  Well-developed, in no acute distress. Psychiatric: Normal mood and affect. Behavior is normal. HEENT: Pupils normal.  Conjunctivae are normal. No scleral icterus. Neck supple.  Cardiovascular: Normal rate, regular rhythm. No edema Pulmonary/chest: Effort normal and breath sounds normal. No wheezing, rales or rhonchi. Abdominal: Soft, nondistended. Nontender. Bowel sounds active throughout. There are no masses palpable. No hepatomegaly. Rectal: Deferred Neurological: Alert and oriented to person place and time. Skin: Skin is warm and dry. No rashes noted.  Data Reviewed: I have personally reviewed following labs and imaging studies  CBC: No flowsheet data found.  CMP: No flowsheet data found.  GFR: CrCl cannot be calculated (No successful lab value found.). Liver Function Tests: No results for input(s): AST, ALT, ALKPHOS, BILITOT, PROT, ALBUMIN in the last 168 hours. No results for input(s): LIPASE, AMYLASE in the last 168 hours. No results for input(s): AMMONIA in the last 168 hours. Coagulation Profile: No results for input(s): INR, PROTIME in the last 168 hours. HbA1C: No results for input(s): HGBA1C in  the last 72 hours. Lipid Profile: No results for input(s): CHOL, HDL, LDLCALC, TRIG, CHOLHDL, LDLDIRECT in the last 72 hours. Thyroid Function Tests: No results for input(s): TSH, T4TOTAL, FREET4, T3FREE, THYROIDAB in the last 72 hours. Anemia Panel: No results for input(s): VITAMINB12, FOLATE, FERRITIN, TIBC, IRON, RETICCTPCT in the last 72 hours.  No results found for this or any previous visit (from the past 240 hour(s)).    Radiology Studies: No results found.    02/11/21, MD 03/05/2021, 11:00 AM  Cc: 03/07/2021, MD

## 2021-03-05 NOTE — Telephone Encounter (Signed)
[facility] Pre-operative Risk Assessment     Request for surgical clearance:     Endoscopy Procedure  What type of surgery is being performed?     EGD with Dil  When is this surgery scheduled?     03-20-2021  What type of clearance is required ?   Pharmacy  Are there any medications that need to be held prior to surgery and how long? Eliquis 24 hour hold  Practice name and name of physician performing surgery?      [facility]  What is your office phone and fax number?      Phone- [phone]  [phone]  Anesthesia type (None, local, MAC, general) ?       MAC

## 2021-03-05 NOTE — Patient Instructions (Addendum)
If you are age 60 or older, your body mass index should be between 23-30. Your Body mass index is 34.9 kg/m. If this is out of the aforementioned range listed, please consider follow up with your Primary Care Provider.  If you are age 52 or younger, your body mass index should be between 19-25. Your Body mass index is 34.9 kg/m. If this is out of the aformentioned range listed, please consider follow up with your Primary Care Provider.   __________________________________________________________  The [facility] providers would like to encourage you to use [facility] to communicate with providers for non-urgent requests or questions.  Due to long hold times on the telephone, sending your provider a message by [facility] may be a faster and more efficient way to get a response.  Please allow 48 business hours for a response.  Please remember that this is for non-urgent requests.   You have been scheduled for an endoscopy. Please follow written instructions given to you at your visit today. If you use inhalers (even only as needed), please bring them with you on the day of your procedure.  You will be contacted by our office prior to your procedure for directions on holding your Eliquis.  If you do not hear from our office 1 week prior to your scheduled procedure, please call [phone] to discuss.   We have sent the following medications to your pharmacy for you to pick up at your convenience: Protonix   Thank you,  Dr. Lynann Bologna

## 2021-03-05 NOTE — Telephone Encounter (Signed)
Patient with diagnosis of afib on Eliquis for anticoagulation.    Procedure: EGD Date of procedure: 03/20/21  CHA2DS2-VASc Score = 2  This indicates a 2.2% annual risk of stroke. The patient's score is based upon: CHF History: 0 HTN History: 1 Diabetes History: 0 Stroke History: 0 Vascular Disease History: 0 Age Score: 0 Gender Score: 1     CrCl 64mL/min using adjusted body weight Platelet count 222K   Per office protocol, patient can hold Eliquis for 1 day prior to procedure as requested. However, per Dr Elberta Fortis' most recent note 12/16/20, plan was to pursue ablation in which case pt cannot hold her Eliquis for 3 weeks prior and 3 months post procedure. I do not see that this is scheduled, will need to clarify if ablation is still being done, in which case EGD procedure will need to fall outside above-mentioned anticoag window.

## 2021-03-06 NOTE — Telephone Encounter (Signed)
LVM for patient to call back. ?

## 2021-03-06 NOTE — Telephone Encounter (Signed)
Patient made aware. She will call her cardiology office because she said her ablation is 11-2 and that her procedure date is 10-13 and that is right around 3 weeks prior.

## 2021-03-13 ENCOUNTER — Telehealth: Payer: Self-pay | Admitting: Cardiology

## 2021-03-13 NOTE — Telephone Encounter (Signed)
Called patient back. Per Dr. Elberta Fortis, Patient will have a CT scan prior to ablation to rule out left atrial appendage thrombus.  Okay to hold anticoagulation for GI procedure.  After GI procedure, will need to continue Eliquis uninterrupted with the last dose being the night before ablation. Patient verbalized understanding. Will send message to Dr. Elberta Fortis nurse so she can follow up with scheduling and ordering CT. Will send message to Dr. Chales Abrahams so he is aware.

## 2021-03-13 NOTE — Telephone Encounter (Signed)
Pt is supposed to be in for ablation on 11-2... appointment is not showing on appt desk.. is pt still having this procedure? Please Advise

## 2021-03-13 NOTE — Telephone Encounter (Signed)
Called patient back. Patient is wanting to get a GI procedure done on 10/13, which would require her to stop eliquis for a day. Patient stated she is suppose to get an ablation on 11/2, not scheduled yet. Patient stated she needs to know if she can still have the ablation on 11/2 if she hold the eliquis for one day, and if not how long does she need to be on eliquis without skipping doses to have ablation done. Will forward to Dr. Elberta Fortis and nurse who is covering him at this time.

## 2021-03-14 NOTE — Telephone Encounter (Signed)
Pt was notified of Dr. Chales Abrahams recommendations. Pt was notified to contact our office after CT scan completed and pt will be rescheduled. Pt verbalized understanding with all questions answered. GI procedure that is currently scheduled now canceled. Pt made aware

## 2021-03-14 NOTE — Telephone Encounter (Signed)
Michelle Perkins, -Please see cardiology plan. -She is to have CT chest prior to ablation to r/o left atrial appendage thrombus -If negative, then can hold Eliquis 24 hours prior to GI procedure  -Please reschedule until above is done.  RG

## 2021-03-19 ENCOUNTER — Telehealth: Payer: Self-pay | Admitting: *Deleted

## 2021-03-19 DIAGNOSIS — I480 Paroxysmal atrial fibrillation: Secondary | ICD-10-CM

## 2021-03-19 NOTE — Telephone Encounter (Signed)
Spoke to patient about ablation schedule dfor 11/2. Aware someone will call to arrange pre ablation CT. Faxed lab orders to [facility] per pt request.  States she must have done there per insurance. Aware I will send instructions via mychart. Patient verbalized understanding and agreeable to plan.

## 2021-03-20 ENCOUNTER — Encounter: Payer: Commercial Managed Care - PPO | Admitting: Gastroenterology

## 2021-03-31 ENCOUNTER — Telehealth (HOSPITAL_COMMUNITY): Payer: Self-pay | Admitting: *Deleted

## 2021-03-31 NOTE — Telephone Encounter (Signed)
Reaching out to patient to offer assistance regarding upcoming cardiac imaging study; pt verbalizes understanding of appt date/time, parking situation and where to check in, pre-test NPO status and medications ordered, and verified current allergies; name and call back number provided for further questions should they arise  Cace Osorto RN Navigator Cardiac Imaging  Heart and Vascular [phone] office [phone] cell  

## 2021-04-01 ENCOUNTER — Other Ambulatory Visit: Payer: Self-pay

## 2021-04-01 ENCOUNTER — Ambulatory Visit (HOSPITAL_COMMUNITY)
Admission: RE | Admit: 2021-04-01 | Discharge: 2021-04-01 | Disposition: A | Discharge status: Home / Self Care | Payer: Commercial Managed Care - PPO | Source: Ambulatory Visit | Attending: Cardiology | Admitting: Cardiology

## 2021-04-01 DIAGNOSIS — I480 Paroxysmal atrial fibrillation: Secondary | ICD-10-CM

## 2021-04-01 IMAGING — CT CT HEART MORPH/PULM VEIN W/ CM & W/O CA SCORE
2 of 6 series · 12 of 20 positions shown, 14 images · IV contrast (Omni 300)
Comparison: None.
COMPARISON: None.

Addendum:
EXAM:
OVER-READ INTERPRETATION  CT CHEST

The following report is an over-read performed by radiologist Dr.
GOBITLHA [REDACTED] on [DATE]. This
over-read does not include interpretation of cardiac or coronary
anatomy or pathology. The coronary calcium score/coronary CTA
interpretation by the cardiologist is attached.
CLINICAL DATA: 60F with atrial fibrillation scheduled for an
ablation.
Cardiac CT/CTA
TECHNIQUE: The patient was scanned on a Siemens Somatom scanner.

[Series 8: 0-90% · axial · 0.36mm/px · z∈[+985,+1105]mm · 7 of 3190 slices shown, 9 images]
[im 399/3190  vessel]
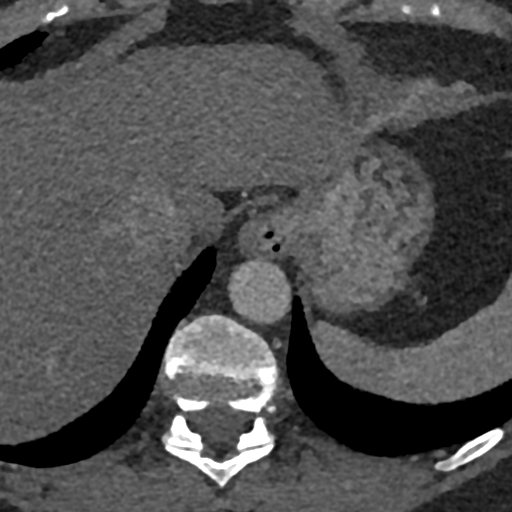
[im 399/3190  lung]
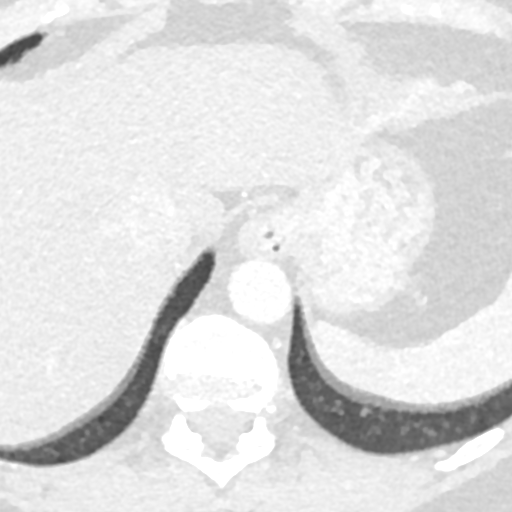
[im 798/3190  vessel]
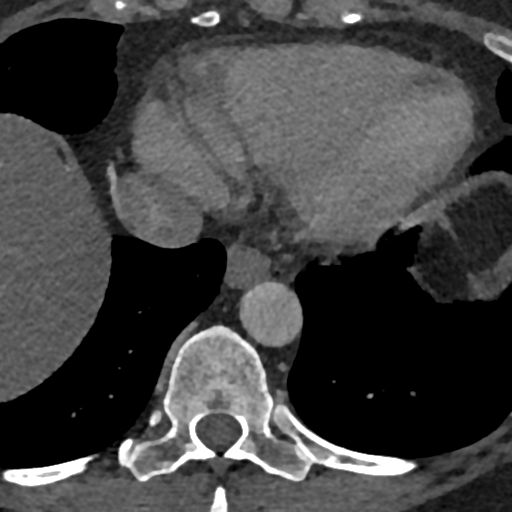
[im 1196/3190  vessel]
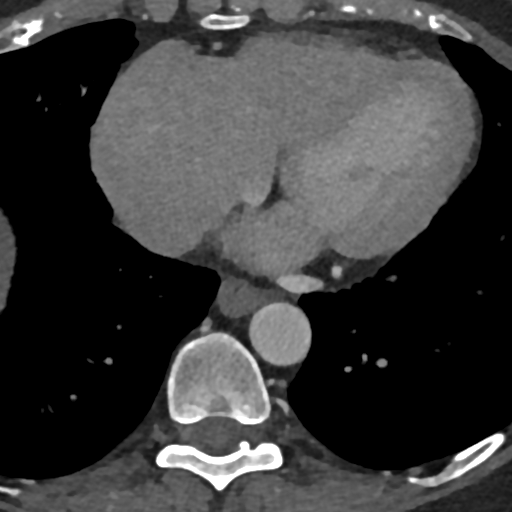
[im 1595/3190  vessel]
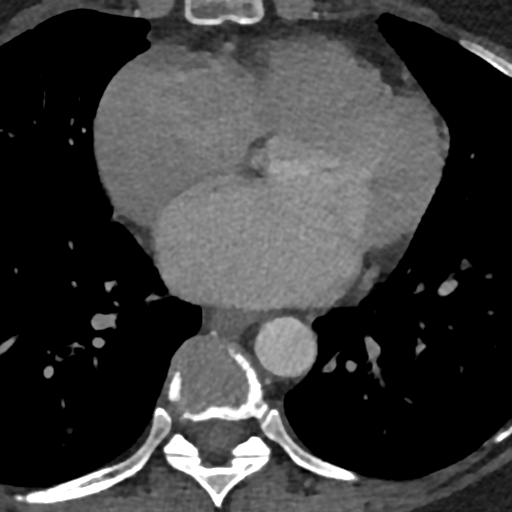
[im 1994/3190  vessel]
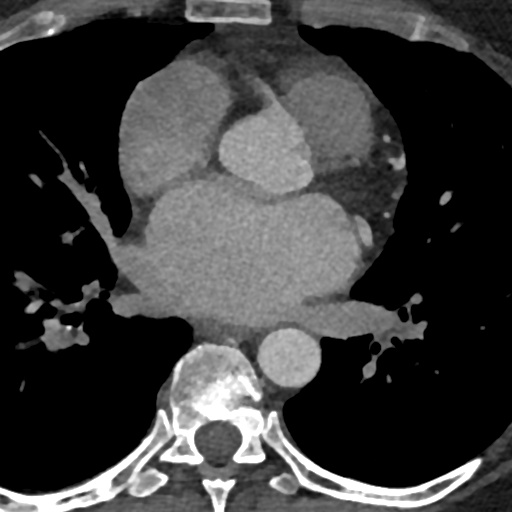
[im 1994/3190  lung]
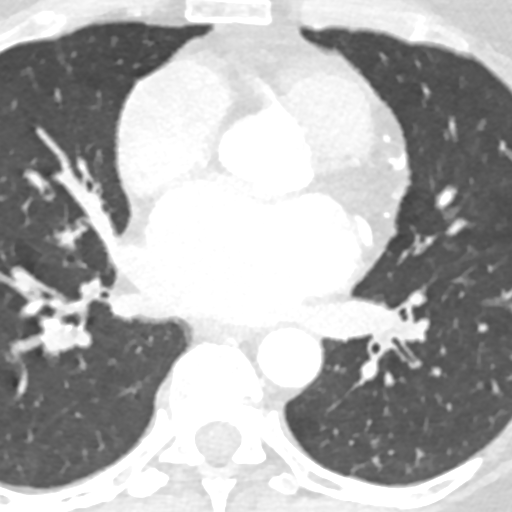
[im 2392/3190  vessel]
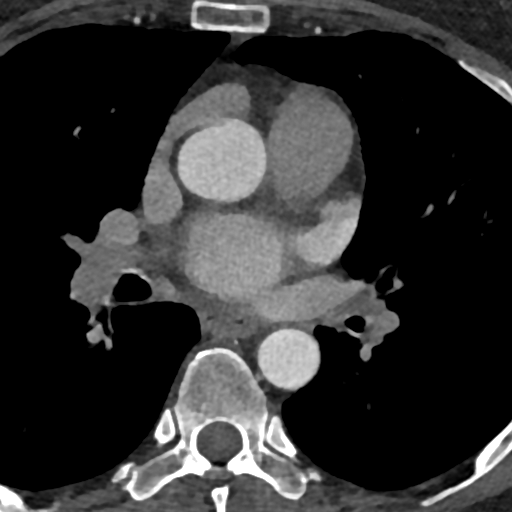
[im 2791/3190  vessel]
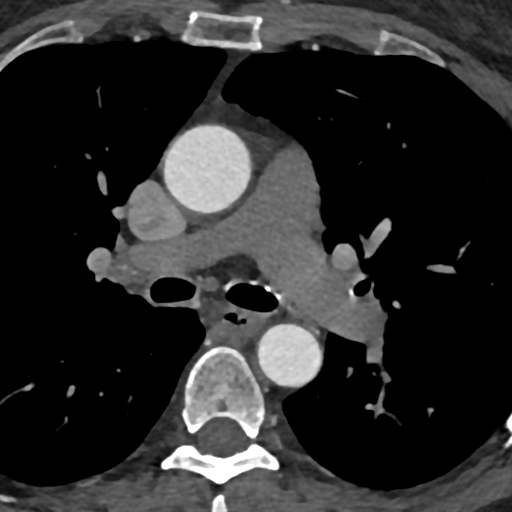

[Series 12: 5-95% · axial · 0.36mm/px · z∈[+1008,+1088]mm · 5 of 2380 slices shown]
[im 397/2380  vessel]
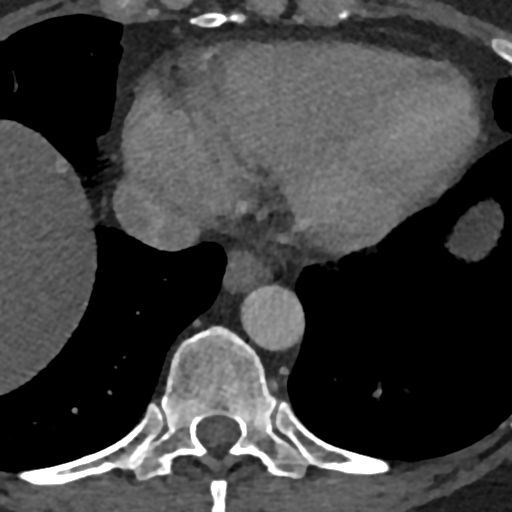
[im 794/2380  vessel]
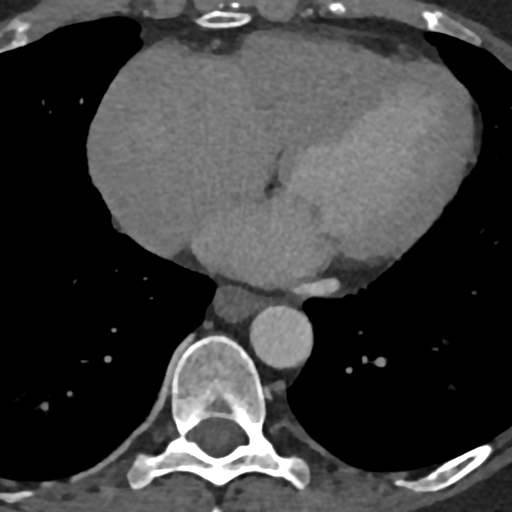
[im 1190/2380  vessel]
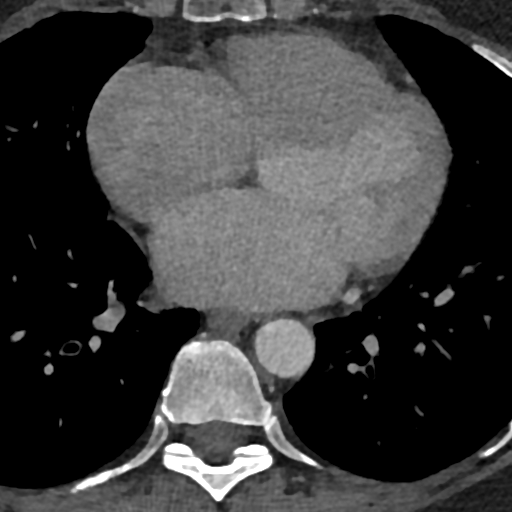
[im 1587/2380  vessel]
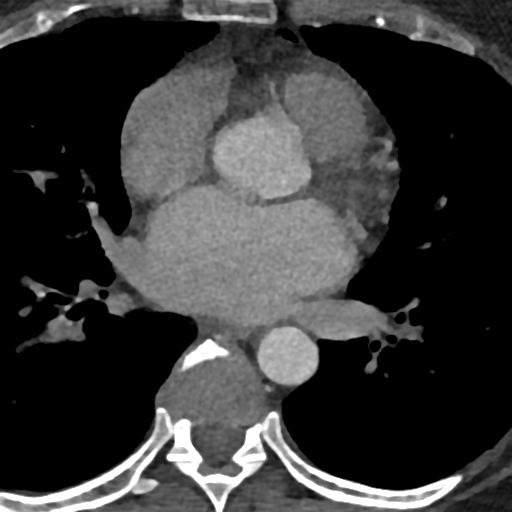
[im 1983/2380  vessel]
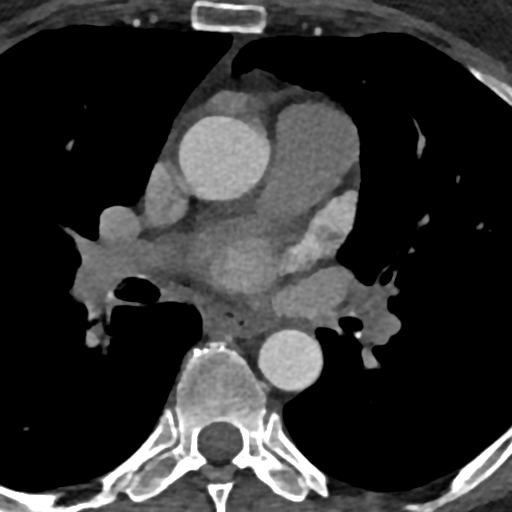

[12 of 20 positions shown; findings below may reference images not displayed]

FINDINGS: Multiple pulmonary nodules are noted bilaterally, largest of which
is in the lateral segment of the right middle lobe (axial image 23
of series 11) measuring 11 x 9 mm (mean diameter of 1 cm). Largest
nodule in the left lung is in the medial left lower lobe (axial
image 29 of series 11) measuring 10 x 8 mm (mean diameter of 9 mm).
Within the visualized portions of the thorax there is no acute
consolidative airspace disease, no pleural effusions, no
pneumothorax and no lymphadenopathy. Visualized portions of the
upper abdomen demonstrates diffuse low attenuation throughout the
visualized hepatic parenchyma, indicative of hepatic steatosis.
Small hiatal hernia. There are no aggressive appearing lytic or
blastic lesions noted in the visualized portions of the skeleton.
IMPRESSION: 1. Multiple pulmonary nodules in the lungs bilaterally measuring up
to 1 cm in diameter. Non-contrast chest CT at 3 months is
recommended. If the nodules are stable at time of repeat CT, then
future CT at 18-24 months (from today's scan) is considered optional
for low-risk patients, but is recommended for high-risk patients.
This recommendation follows the consensus statement: Guidelines for
Management of Incidental Pulmonary Nodules Detected on CT Images:
2. Hepatic steatosis.
FINDINGS: A 120 kV prospective scan was triggered in the descending thoracic
aorta at 111 HU's. Gantry rotation speed was 280 msecs and
collimation was .9 mm. No beta blockade and no NTG was given. The 3D
data set was reconstructed in 5% intervals of the 60-80 % of the R-R
cycle. Diastolic phases were analyzed on a dedicated work station
using MPR, MIP and VRT modes. The patient received 80 cc of
contrast.

There is normal pulmonary vein drainage into the left atrium (2 on
the right and 2 on the left) with ostial measurements as follows:

RUPV: 18.3 x 15.5 mm

RLPV: 18.0 x 16.8 mm

LUPV: 14.2 x 14.7 mm

LLPV: 15.2 x 11.6 mm

The left atrial appendage is large windsock type with two lobes and
ostial size 23 x 18 mm and length 28 mm. There is no thrombus in the
left atrial appendage.

The esophagus runs in the left atrial midline and is not in the
proximity to any of the pulmonary veins.

Aorta: Normal caliber. 3.0 cm. No dissection. Calcification of the
aortic arch.

Aortic Valve:  Trileaflet.  No calcifications.

Coronary Arteries: Normal coronary origin. Coronary arteries are not
well opacified. The study was performed without use of NTG and
insufficient for plaque evaluation. Coronary calcium score 0.
IMPRESSION: 1. There is normal pulmonary vein drainage into the left atrium.

2. The left atrial appendage is large windsock type with two lobes
and ostial size 23 x 18 mm and length 28 mm. There is no thrombus in
the left atrial appendage.

3. The esophagus runs in the left atrial midline and is not in the
proximity to any of the pulmonary veins.

4. Coronary calcium score 0.

5. Calcification of the aortic arch.

*** End of Addendum ***
EXAM:
OVER-READ INTERPRETATION  CT CHEST

The following report is an over-read performed by radiologist Dr.
GOBITLHA [REDACTED] on [DATE]. This
over-read does not include interpretation of cardiac or coronary
anatomy or pathology. The coronary calcium score/coronary CTA
interpretation by the cardiologist is attached.
FINDINGS: Multiple pulmonary nodules are noted bilaterally, largest of which
is in the lateral segment of the right middle lobe (axial image 23
of series 11) measuring 11 x 9 mm (mean diameter of 1 cm). Largest
nodule in the left lung is in the medial left lower lobe (axial
image 29 of series 11) measuring 10 x 8 mm (mean diameter of 9 mm).
Within the visualized portions of the thorax there is no acute
consolidative airspace disease, no pleural effusions, no
pneumothorax and no lymphadenopathy. Visualized portions of the
upper abdomen demonstrates diffuse low attenuation throughout the
visualized hepatic parenchyma, indicative of hepatic steatosis.
Small hiatal hernia. There are no aggressive appearing lytic or
blastic lesions noted in the visualized portions of the skeleton.
IMPRESSION: 1. Multiple pulmonary nodules in the lungs bilaterally measuring up
to 1 cm in diameter. Non-contrast chest CT at 3 months is
recommended. If the nodules are stable at time of repeat CT, then
future CT at 18-24 months (from today's scan) is considered optional
for low-risk patients, but is recommended for high-risk patients.
This recommendation follows the consensus statement: Guidelines for
Management of Incidental Pulmonary Nodules Detected on CT Images:
2. Hepatic steatosis.

## 2021-04-01 MED ORDER — NITROGLYCERIN 0.4 MG SL SUBL: 0.8000 mg | SUBLINGUAL_TABLET | Freq: Once | SUBLINGUAL | Status: DC

## 2021-04-01 MED ORDER — IOHEXOL 350 MG/ML SOLN
80.0000 mL | Freq: Once | INTRAVENOUS | Status: AC | PRN
  Administered 2021-04-01: 80 mL via INTRAVENOUS

## 2021-04-02 ENCOUNTER — Other Ambulatory Visit: Payer: Self-pay | Admitting: Cardiology

## 2021-04-03 ENCOUNTER — Telehealth: Payer: Self-pay | Admitting: Cardiology

## 2021-04-03 NOTE — Telephone Encounter (Signed)
Patient calling in with question/concern with upcoming procedure. Please advise

## 2021-04-04 NOTE — Telephone Encounter (Signed)
Pt reports not feeling well recently and has "come down with something". She is experiencing a cough and worried about proceeding with ablation next week. Pt advised to see PCP/urgent care today for further evaluation and possible medication for symptoms. Advised to call Monday if still having issues to discuss if can proceed with ablation procedure. Patient verbalized understanding and agreeable to plan.

## 2021-04-07 ENCOUNTER — Telehealth: Payer: Self-pay | Admitting: Cardiology

## 2021-04-07 NOTE — Telephone Encounter (Signed)
Patient was calling to speak with sheri she needs her ablasin for Wednesday to be reschedule. Please advise

## 2021-04-07 NOTE — Telephone Encounter (Signed)
Pt calling to update me that she is still not feeling well still. She did go see PCP end of last week as per our discussion. She reports that she was prescribed abx and tessalon pearls. She is still not feeling well and the cough is still persistent and sore throat accompanying it. Aware I will get ablation cancelled and be in touch to reschedule her. Dr. Elberta Fortis made aware. Patient verbalized understanding and agreeable to plan.

## 2021-04-09 ENCOUNTER — Ambulatory Visit (HOSPITAL_COMMUNITY)
Admission: RE | Admit: 2021-04-09 | Payer: Commercial Managed Care - PPO | Source: Home / Self Care | Admitting: Cardiology

## 2021-04-09 ENCOUNTER — Encounter (HOSPITAL_COMMUNITY): Admission: RE | Payer: Self-pay | Source: Home / Self Care

## 2021-04-09 SURGERY — ATRIAL FIBRILLATION ABLATION
Anesthesia: General

## 2021-05-07 ENCOUNTER — Ambulatory Visit (HOSPITAL_COMMUNITY): Payer: Commercial Managed Care - PPO | Admitting: Physician Assistant

## 2021-05-21 ENCOUNTER — Telehealth: Payer: Self-pay | Admitting: Cardiology

## 2021-05-21 MED ORDER — DILTIAZEM HCL 30 MG PO TABS: 30.0000 mg | ORAL_TABLET | ORAL | 2 refills | Status: DC | PRN

## 2021-05-21 NOTE — Telephone Encounter (Signed)
°*  STAT* If patient is at the pharmacy, call can be transferred to refill team.   1. Which medications need to be refilled? (please list name of each medication and dose if known)  diltiazem (CARDIZEM) 30 MG tablet  2. Which pharmacy/location (including street and city if local pharmacy) is medication to be sent to? [id number] - [place], [place] - [address]  3. Do they need a 30 day or 90 day supply?  90 day refill

## 2021-05-21 NOTE — Telephone Encounter (Signed)
Medication refilled and scheduled for follow up patient aware.

## 2021-05-26 ENCOUNTER — Telehealth: Payer: Self-pay | Admitting: *Deleted

## 2021-05-26 NOTE — Telephone Encounter (Signed)
Pt does not wish to reschedule ablation at this time. She is still experiencing respiratory issues, currently 2nd bout of PNA.  She would like to keep February follow up and go from there. She will call the office if between now and then she has any issues with her AFib. Pt appreciates the follow up and agreeable to plan.

## 2021-06-17 ENCOUNTER — Other Ambulatory Visit: Payer: Self-pay

## 2021-06-17 DIAGNOSIS — E559 Vitamin D deficiency, unspecified: Secondary | ICD-10-CM | POA: Insufficient documentation

## 2021-06-17 DIAGNOSIS — Z862 Personal history of diseases of the blood and blood-forming organs and certain disorders involving the immune mechanism: Secondary | ICD-10-CM | POA: Insufficient documentation

## 2021-06-18 ENCOUNTER — Encounter: Payer: Self-pay | Admitting: Cardiology

## 2021-06-18 ENCOUNTER — Ambulatory Visit (INDEPENDENT_AMBULATORY_CARE_PROVIDER_SITE_OTHER): Payer: Commercial Managed Care - PPO | Admitting: Cardiology

## 2021-06-18 ENCOUNTER — Other Ambulatory Visit: Payer: Self-pay

## 2021-06-18 VITALS — BP 124/72 | HR 89 | Ht 68.0 in | Wt 225.4 lb

## 2021-06-18 DIAGNOSIS — I4819 Other persistent atrial fibrillation: Secondary | ICD-10-CM

## 2021-06-18 DIAGNOSIS — R0609 Other forms of dyspnea: Secondary | ICD-10-CM | POA: Diagnosis not present

## 2021-06-18 DIAGNOSIS — R002 Palpitations: Secondary | ICD-10-CM

## 2021-06-18 NOTE — Patient Instructions (Signed)
Medication Instructions:  °Your physician recommends that you continue on your current medications as directed. Please refer to the Current Medication list given to you today. ° °*If you need a refill on your cardiac medications before your next appointment, please call your pharmacy* ° ° °Lab Work: °None °If you have labs (blood work) drawn today and your tests are completely normal, you will receive your results only by: °MyChart Message (if you have MyChart) OR °A paper copy in the mail °If you have any lab test that is abnormal or we need to change your treatment, we will call you to review the results. ° ° °Testing/Procedures: °None ° ° °Follow-Up: °At [facility], you and your health needs are our priority.  As part of our continuing mission to provide you with exceptional heart care, we have created designated Provider Care Teams.  These Care Teams include your primary Cardiologist (physician) and Advanced Practice Providers (APPs -  Physician Assistants and Nurse Practitioners) who all work together to provide you with the care you need, when you need it. ° °We recommend signing up for the patient portal called "MyChart".  Sign up information is provided on this After Visit Summary.  MyChart is used to connect with patients for Virtual Visits (Telemedicine).  Patients are able to view lab/test results, encounter notes, upcoming appointments, etc.  Non-urgent messages can be sent to your provider as well.   °To learn more about what you can do with MyChart, go to [email or web address].   ° °Your next appointment:   °3 month(s) ° °The format for your next appointment:   °In Person ° °Provider:   °Robert Krasowski, MD  ° ° °Other Instructions °None ° °

## 2021-06-18 NOTE — Progress Notes (Signed)
Cardiology Office Note:    Date:  06/18/2021   ID:  Michelle Perkins, DOB 05/26/1961, MRN [id number]  PCP:  Philemon Kingdom, MD  Cardiologist:  Gypsy Balsam, MD    Referring MD: Philemon Kingdom, MD   Chief Complaint  Patient presents with   Follow-up  I still have atrial fibrillation I do not feel well with it  History of Present Illness:    Michelle Perkins is a 61 y.o. female   past medical history she does have history of paroxysmal atrial flutter she has been on propafenone she required 2 cardioversions so far last cardioversion was done in April as she stay within normal rhythm for about a month and she flipped to what appears to be atrial flutter at this time she is coming today to my office to talk about options she does not feel well when she got atrial flutter she is a nurse working in the hospital and she is still able to perform her activities of daily living but the irregularity of her heartbeat and tachycardia bothers her.  She did see our EP colleagues and there was discussion about potentially atrial fibrillation ablation She comes today to my office for follow-up.  Overall she is doing fine still in atrial fibrillation chronically to stated but clearly affecting quality of life.  We had a long discussion about potentially doing atrial fibrillation ablation and I think in my opinion she need to pursue that see his atrial fibrillation ablations clearly affecting quality of life and antiarrhythmic therapy seems to be ineffective.  She does have appointment with my EP colleague within the next few weeks.  Discussion will be about atrial fibrillation ablation.  She was getting ready to do it before but became sick with pneumonia and it was postponed and never happened.  Past Medical History:  Diagnosis Date   Dyspnea on exertion 06/06/2018   History of anemia    Palpitations 06/06/2018   Paroxysmal atrial fibrillation (HCC) 06/06/2018   Persistent atrial fibrillation (HCC)  07/24/2019   Snoring 11/07/2019   Vitamin D deficiency     Past Surgical History:  Procedure Laterality Date   APPENDECTOMY      Current Medications: Current Meds  Medication Sig   apixaban (ELIQUIS) 5 MG TABS tablet TAKE 1 TABLET BY MOUTH  TWICE DAILY   diltiazem (CARDIZEM CD) 180 MG 24 hr capsule Take 1 capsule (180 mg total) by mouth at bedtime.   diltiazem (CARDIZEM) 30 MG tablet Take 1 tablet (30 mg total) by mouth as needed (tachycardia). If heart rate is over 120   metoprolol succinate (TOPROL-XL) 50 MG 24 hr tablet Take 1 tablet (50 mg total) by mouth at bedtime.   pantoprazole (PROTONIX) 40 MG tablet Take 1 tablet (40 mg total) by mouth daily.   propafenone (RYTHMOL SR) 225 MG 12 hr capsule TAKE 1 CAPSULE BY MOUTH  TWICE DAILY     Allergies:   Codeine, Levaquin [levofloxacin], Lidocaine, and Penicillins   Social History   Socioeconomic History   Marital status: Married    Spouse name: Not on file   Number of children: 2   Years of education: Not on file   Highest education level: Not on file  Occupational History   Not on file  Tobacco Use   Smoking status: Never   Smokeless tobacco: Never  Vaping Use   Vaping Use: Never used  Substance and Sexual Activity   Alcohol use: Never   Drug use: Never  Sexual activity: Not on file  Other Topics Concern   Not on file  Social History Narrative   Not on file   Social Determinants of Health   Financial Resource Strain: Not on file  Food Insecurity: Not on file  Transportation Needs: Not on file  Physical Activity: Not on file  Stress: Not on file  Social Connections: Not on file     Family History: The patient's family history includes Atrial fibrillation in her brother, father, mother, and sister; Heart failure in her father; Lung cancer in her mother. There is no history of Colon cancer, Pancreatic cancer, Esophageal cancer, Liver disease, or Rectal cancer. ROS:   Please see the history of present illness.     All 14 point review of systems negative except as described per history of present illness  EKGs/Labs/Other Studies Reviewed:      Recent Labs: No results found for requested labs within last 8760 hours.  Recent Lipid Panel No results found for: CHOL, TRIG, HDL, CHOLHDL, VLDL, LDLCALC, LDLDIRECT  Physical Exam:    VS:  BP 124/72 (BP Location: Left Arm, Patient Position: Sitting)    Pulse 89    Ht 5\' 8"  (1.727 m)    Wt 225 lb 6.4 oz (102.2 kg)    SpO2 97%    BMI 34.27 kg/m     Wt Readings from Last 3 Encounters:  06/18/21 225 lb 6.4 oz (102.2 kg)  03/05/21 229 lb 8 oz (104.1 kg)  12/16/20 228 lb 9.6 oz (103.7 kg)     GEN:  Well nourished, well developed in no acute distress HEENT: Normal NECK: No JVD; No carotid bruits LYMPHATICS: No lymphadenopathy CARDIAC: Irregularly irregular, no murmurs, no rubs, no gallops RESPIRATORY:  Clear to auscultation without rales, wheezing or rhonchi  ABDOMEN: Soft, non-tender, non-distended MUSCULOSKELETAL:  No edema; No deformity  SKIN: Warm and dry LOWER EXTREMITIES: no swelling NEUROLOGIC:  Alert and oriented x 3 PSYCHIATRIC:  Normal affect   ASSESSMENT:    1. Persistent atrial fibrillation (HCC)   2. Dyspnea on exertion   3. Palpitations    PLAN:    In order of problems listed above:  Persistent atrial fibrillation I think we reached a point that atrial fibrillation ablation should be strongly considered.  She does have appoint with my EP colleague will encourage her to follow-up with that visit. Dyspnea on exertion, stable.  We will consider redoing echocardiogram since previous echocardiogram was done in 2020. Palpitations which obviously is related to atrial fibrillation. Dyslipidemia have latest data from 2018 we will make arrangements to have fasting lipid profile done.  I will wait however for atrial fibrillation to be done   Medication Adjustments/Labs and Tests Ordered: Current medicines are reviewed at length with  the patient today.  Concerns regarding medicines are outlined above.  No orders of the defined types were placed in this encounter.  Medication changes: No orders of the defined types were placed in this encounter.   Signed, 2019, MD, [facility] 06/18/2021 8:58 AM    [facility]

## 2021-07-14 ENCOUNTER — Ambulatory Visit: Payer: Commercial Managed Care - PPO | Admitting: Cardiology

## 2021-07-15 NOTE — Telephone Encounter (Signed)
error 

## 2021-07-17 ENCOUNTER — Encounter (HOSPITAL_COMMUNITY): Payer: Self-pay | Admitting: Radiology

## 2021-08-20 ENCOUNTER — Other Ambulatory Visit: Payer: Self-pay | Admitting: Cardiology

## 2021-09-29 ENCOUNTER — Ambulatory Visit: Payer: Commercial Managed Care - PPO | Admitting: Cardiology

## 2021-09-29 ENCOUNTER — Encounter: Payer: Self-pay | Admitting: Cardiology

## 2021-09-29 VITALS — BP 138/76 | HR 93 | Ht 68.0 in | Wt 222.8 lb

## 2021-09-29 DIAGNOSIS — I480 Paroxysmal atrial fibrillation: Secondary | ICD-10-CM

## 2021-09-29 NOTE — Progress Notes (Signed)
? ?Electrophysiology Office Note ? ? ?Date:  09/29/2021  ? ?ID:  Michelle Perkins, DOB Feb 19, 1961, MRN [id number] ? ?PCP:  Ernestene Kiel, MD  ?Cardiologist:  Agustin Cree ?Primary Electrophysiologist:  Titiana Severa Meredith Leeds, MD   ? ?Chief Complaint: AF ?  ?History of Present Illness: ?Michelle Perkins is a 61 y.o. female who is being seen today for the evaluation of AF at the request of Ernestene Kiel, MD. Presenting today for electrophysiology evaluation. ? ?She has a history significant for paroxysmal atrial fibrillation and atrial flutter.  She is currently on propafenone.  She has had multiple cardioversions in the past.  She has done well since starting propafenone but did go into atrial flutter with symptoms of fatigue.  She was initially planned for ablation, but began to have pulmonary issues and thus ablation was canceled. ? ?Today, denies symptoms of palpitations, chest pain, shortness of breath, orthopnea, PND, lower extremity edema, claudication, dizziness, presyncope, syncope, bleeding, or neurologic sequela. The patient is tolerating medications without difficulties.  Her lung issues have done well over the last few months.  She does have baseline shortness of breath, but she does not feel that she has been worsening.  Her main concern today is GI issues.  She has trouble swallowing.  She would like to have an ablation for her atrial fibrillation and atrial flutter but she would prefer to have her GI issues addressed prior to that. ? ? ?Past Medical History:  ?Diagnosis Date  ? Dyspnea on exertion 06/06/2018  ? History of anemia   ? Palpitations 06/06/2018  ? Paroxysmal atrial fibrillation ([place]) 06/06/2018  ? Persistent atrial fibrillation ([place]) 07/24/2019  ? Snoring 11/07/2019  ? Vitamin D deficiency   ? ?Past Surgical History:  ?Procedure Laterality Date  ? APPENDECTOMY    ? ? ? ?Current Outpatient Medications  ?Medication Sig Dispense Refill  ? apixaban (ELIQUIS) 5 MG TABS tablet TAKE 1 TABLET BY MOUTH   TWICE DAILY 180 tablet 3  ? diltiazem (CARDIZEM CD) 180 MG 24 hr capsule Take 1 capsule (180 mg total) by mouth at bedtime. 90 capsule 3  ? diltiazem (CARDIZEM) 30 MG tablet Take 1 tablet (30 mg total) by mouth as needed (tachycardia). If heart rate is over 120 90 tablet 2  ? metoprolol succinate (TOPROL-XL) 50 MG 24 hr tablet Take 1 tablet (50 mg total) by mouth at bedtime. 90 tablet 3  ? pantoprazole (PROTONIX) 40 MG tablet Take 1 tablet (40 mg total) by mouth daily. 90 tablet 3  ? propafenone (RYTHMOL SR) 225 MG 12 hr capsule TAKE 1 CAPSULE BY MOUTH  TWICE DAILY 180 capsule 3  ? ?No current facility-administered medications for this visit.  ? ? ?Allergies:   Codeine, Levaquin [levofloxacin], Lidocaine, and Penicillins  ? ?Social History:  The patient  reports that she has never smoked. She has never used smokeless tobacco. She reports that she does not drink alcohol and does not use drugs.  ? ?Family History:  The patient's family history includes Atrial fibrillation in her brother, father, mother, and sister; Heart failure in her father; Lung cancer in her mother.  ? ?ROS:  Please see the history of present illness.   Otherwise, review of systems is positive for none.   All other systems are reviewed and negative.  ? ?PHYSICAL EXAM: ?VS:  BP 138/76   Pulse 93   Ht 5\' 8"  (1.727 m)   Wt 222 lb 12.8 oz (101.1 kg)   SpO2 96%   BMI  33.88 kg/m?  , BMI Body mass index is 33.88 kg/m?. ?GEN: Well nourished, well developed, in no acute distress  ?HEENT: normal  ?Neck: no JVD, carotid bruits, or masses ?Cardiac: RRR; no murmurs, rubs, or gallops,no edema  ?Respiratory:  clear to auscultation bilaterally, normal work of breathing ?GI: soft, nontender, nondistended, + BS ?MS: no deformity or atrophy  ?Skin: warm and dry ?Neuro:  Strength and sensation are intact ?Psych: euthymic mood, full affect ? ?EKG:  EKG is ordered today. ?Personal review of the ekg ordered shows atrial flutter, rate 93 ? ?Recent Labs: ?No results  found for requested labs within last 8760 hours.  ? ? ?Lipid Panel  ?No results found for: CHOL, TRIG, HDL, CHOLHDL, VLDL, LDLCALC, LDLDIRECT ? ? ?Wt Readings from Last 3 Encounters:  ?09/29/21 222 lb 12.8 oz (101.1 kg)  ?06/18/21 225 lb 6.4 oz (102.2 kg)  ?03/05/21 229 lb 8 oz (104.1 kg)  ?  ? ? ?Other studies Reviewed: ?Additional studies/ records that were reviewed today include: TTE 06/10/2018 ?Review of the above records today demonstrates:  ?Ejection fraction 55 to 60% ?Right ventricular normal size and function ?Mild aortic sclerosis ?Trace mitral regurgitation ?Left atrium normal size ? ? ?ASSESSMENT AND PLAN: ? ?1.  Persistent atrial fibrillation/atrial flutter: Currently on propafenone 225 mg twice daily, diltiazem 180 mg daily, Eliquis 5 mg twice daily.  High risk medication monitoring for propafenone.  CHA2DS2-VASc of 1.  She was initially planned for ablation, though she had pulmonary issues.  At this point, she is ready for ablation, though she has been having issues with her esophagus.  She states that she would like to get her esophageal issues taken care of prior to getting any ablation as she is concerned about her esophageal complications of ablation.  I Brazen Domangue discuss this further with her gastroenterologist. ? ? ? ?Current medicines are reviewed at length with the patient today.   ?The patient does not have concerns regarding her medicines.  The following changes were made today: None ? ?Labs/ tests ordered today include:  ?Orders Placed This Encounter  ?Procedures  ? EKG 12-Lead  ? ? ? ? ?Disposition:   FU with Ajla Mcgeachy 6 months ? ?Signed, ?Kristelle Cavallaro Meredith Leeds, MD  ?09/29/2021 11:21 AM    ? ?[facility] ?[address] ?[address] ?[place] [address] ?[phone] (office) ?[phone] (fax) ?

## 2021-09-29 NOTE — Patient Instructions (Addendum)
Medication Instructions:  ?Your physician recommends that you continue on your current medications as directed. Please refer to the Current Medication list given to you today. ? ?*If you need a refill on your cardiac medications before your next appointment, please call your pharmacy* ? ? ?Lab Work: ?Pre procedure labs - SEE PROCEDURE INSTRUCTION LETTER:  BMP & CBC ? ?If you have labs (blood work) drawn today and your tests are completely normal, you will receive your results only by: ?MyChart Message (if you have MyChart) OR ?A paper copy in the mail ?If you have any lab test that is abnormal or we need to change your treatment, we will call you to review the results. ? ? ?Testing/Procedures: ?Your physician has requested that you have cardiac CT within 7 days PRIOR to your ablation. Cardiac computed tomography (CT) is a painless test that uses an x-ray machine to take clear, detailed pictures of your heart.  Please follow instruction below located under "other instructions". ?You will get a call from our office to schedule the date for this test. ? ?Your physician has recommended that you have an ablation. Catheter ablation is a medical procedure used to treat some cardiac arrhythmias (irregular heartbeats). During catheter ablation, a long, thin, flexible tube is put into a blood vessel in your groin (upper thigh), or neck. This tube is called an ablation catheter. It is then guided to your heart through the blood vessel. Radio frequency waves destroy small areas of heart tissue where abnormal heartbeats may cause an arrhythmia to start. Please follow instruction letter given to you today. ? Please call the office when ready to schedule, after you have seen the doctor about your esophageal issues ? ? ?Follow-Up: ?At [facility], you and your health needs are our priority.  As part of our continuing mission to provide you with exceptional heart care, we have created designated Provider Care Teams.  These Care  Teams include your primary Cardiologist (physician) and Advanced Practice Providers (APPs -  Physician Assistants and Nurse Practitioners) who all work together to provide you with the care you need, when you need it. ? ?We recommend signing up for the patient portal called "MyChart".  Sign up information is provided on this After Visit Summary.  MyChart is used to connect with patients for Virtual Visits (Telemedicine).  Patients are able to view lab/test results, encounter notes, upcoming appointments, etc.  Non-urgent messages can be sent to your provider as well.   ?To learn more about what you can do with MyChart, go to [email or web address].   ? ?Your next appointment:   ?1 month(s) after your ablation ? ?The format for your next appointment:   ?In Person ? ?Provider:   ?AFib clinic ? ? ?Thank you for choosing [facility]!! ? ? ?Trinidad Curet, RN ?[phone] ? ? ? ?Other Instructions ? ?CT INSTRUCTIONS ?Your cardiac CT will be scheduled at:  ?[facility] ?[address] ?[place], [place] [address] ?[phone] ? ?Please arrive at the [facility] and Children's Entrance (Entrance C2) of [facility] 30 minutes prior to test start time. ?You can use the FREE valet parking offered at entrance C (encouraged to control the heart rate for the test)  ?Proceed to the [facility] Radiology Department (first floor) to check-in and test prep. ? ?All radiology patients and guests should use entrance C2 at [facility], accessed from [facility], even though the hospital's physical address listed is [address] ? ? ? ?  Please follow these instructions carefully (unless otherwise directed): ? ?On the Night Before the Test: ?Be sure to Drink plenty of water. ?Do not consume any caffeinated/decaffeinated beverages or chocolate 12 hours prior to your test. ?Do not take any antihistamines 12 hours prior to your test. ? ?On the Day of the Test: ?Drink plenty of water until 1  hour prior to the test. ?Do not eat any food 4 hours prior to the test. ?You may take your regular medications prior to the test.  ?Take metoprolol (Lopressor) two hours prior to test. ?HOLD Furosemide/Hydrochlorothiazide morning of the test. ?FEMALES- please wear underwire-free bra if available, avoid dresses & tight clothing ?     ?After the Test: ?Drink plenty of water. ?After receiving IV contrast, you may experience a mild flushed feeling. This is normal. ?On occasion, you may experience a mild rash up to 24 hours after the test. This is not dangerous. If this occurs, you can take Benadryl 25 mg and increase your fluid intake. ?If you experience trouble breathing, this can be serious. If it is severe call 911 IMMEDIATELY. If it is mild, please call our office. ?If you take any of these medications: Glipizide/Metformin, Avandament, Glucavance, please do not take 48 hours after completing test unless otherwise instructed. ? ?We will call to schedule your test 2-4 weeks out understanding that some insurance companies will need an authorization prior to the service being performed.  ? ?For non-scheduling related questions, please contact the cardiac imaging nurse navigator should you have any questions/concerns: ?Marchia Bond, Cardiac Imaging Nurse Navigator ?Gordy Clement, Cardiac Imaging Nurse Navigator ?[facility] ?Direct Office Dial: [phone]  ? ?For scheduling needs, including cancellations and rescheduling, please call [place], [phone]. ? ? ? ? ? Electrophysiology/Ablation Procedure Instructions ?  ?You are scheduled for a(n)  ablation on __________ with Dr. Allegra Lai. ?  ?1.   Pre procedure testing- ?            A.  LAB WORK --- On ________  for your pre procedure blood work.  You can stop by the [facility] office, and you do NOT need to be fasting.  ?  ?On the day of your procedure ___________ you will go to [facility] hospital [phone] [employer]) at _____________.   You will go to the main entrance [address] [employer]) and enter where the [employer] parking staff are.  Your driver will drop you off and you will head down the hallway to ADMITTING.  You may have one support person come in to the hospital with you.  They will be asked to wait in the waiting room. It is OK to have someone drop you off and come back when you are ready to be discharged. ?  ?3.   Do not eat or drink after midnight prior to your procedure. ?  ?4.   On the morning of your procedure do NOT take any medication. ?Do not miss any doses of your blood thinner prior to the morning of your procedure or your procedure will need to be rescheduled. ?  ?5.  Plan for an overnight stay but you may be discharged after your procedure, if you use your phone frequently bring your phone charger. If you are discharged after your procedure you will need someone to drive you home and be with you for 24 hours after your procedure. ?  ?6. You will follow up with the AFIB clinic 4 weeks after your procedure.  You  will follow up with Dr. Curt Bears  3 months after your procedure.  These appointments will be made for you. ?  ?7. FYI: For your safety, and to allow [place] to monitor your vital signs accurately during the surgery/procedure we request that if you have artificial nails, gel coating, SNS etc. Please have those removed prior to your surgery/procedure. Not having the nail coverings /polish removed may result in cancellation or delay of your surgery/procedure. ? ?* If you have ANY questions please call the office [phone] and ask for Keairra Bardon RN or send me a MyChart message ?  ?* Occasionally, EP Studies and ablations can become lengthy.  Please make your family aware of this before your procedure starts.  Average time ranges from 2-8 hours for EP studies/ablations.  Your physician will call your family after the procedure with the results.                                 ? ? ? ? ?Cardiac Ablation ?Cardiac ablation is a procedure to  destroy (ablate) some heart tissue that is sending bad signals. These bad signals cause problems in heart rhythm. ?The heart has many areas that make these signals. If there are problems in these areas,

## 2021-10-07 ENCOUNTER — Ambulatory Visit: Payer: Commercial Managed Care - PPO | Admitting: Cardiology

## 2021-10-07 ENCOUNTER — Encounter: Payer: Self-pay | Admitting: Cardiology

## 2021-10-07 VITALS — BP 124/76 | HR 56 | Ht 68.0 in | Wt 223.8 lb

## 2021-10-07 DIAGNOSIS — R0609 Other forms of dyspnea: Secondary | ICD-10-CM

## 2021-10-07 DIAGNOSIS — I4819 Other persistent atrial fibrillation: Secondary | ICD-10-CM

## 2021-10-07 DIAGNOSIS — Z862 Personal history of diseases of the blood and blood-forming organs and certain disorders involving the immune mechanism: Secondary | ICD-10-CM | POA: Diagnosis not present

## 2021-10-07 NOTE — Patient Instructions (Signed)

## 2021-10-07 NOTE — Progress Notes (Signed)
?Cardiology Office Note:   ? ?Date:  10/07/2021  ? ?ID:  Michelle Perkins, DOB 1960-07-22, MRN [id number] ? ?PCP:  Philemon Kingdom, MD  ?Cardiologist:  Gypsy Balsam, MD   ? ?Referring MD: Philemon Kingdom, MD  ? ?Chief Complaint  ?Patient presents with  ? Follow-up  ?Overall doing fine but palpitations and she is have decreased ability to exercise because of this ? ?History of Present Illness:   ? ?Michelle Perkins is a 61 y.o. female past medical history significant for paroxysmal atrial flutter she had 2 cardioversions performed last cardioversion in April of last year stay normal rhythm for a month and then flew back to atrial flutter/fibrillation.  She does have a discussion with the EP team and she is already contemplating having ablation done she had some doubt about is still debating but I think is close to commit to it.  Overall she says she is doing fine she does too much palpitation but she is weak tired and exhausted.  She said she can notice her ability to exercise much diminished because of that ? ?Past Medical History:  ?Diagnosis Date  ? Dyspnea on exertion 06/06/2018  ? History of anemia   ? Palpitations 06/06/2018  ? Paroxysmal atrial fibrillation ([facility]) 06/06/2018  ? Persistent atrial fibrillation ([facility]) 07/24/2019  ? Snoring 11/07/2019  ? Vitamin D deficiency   ? ? ?Past Surgical History:  ?Procedure Laterality Date  ? APPENDECTOMY    ? ? ?Current Medications: ?Current Meds  ?Medication Sig  ? apixaban (ELIQUIS) 5 MG TABS tablet TAKE 1 TABLET BY MOUTH  TWICE DAILY (Patient taking differently: Take 5 mg by mouth 2 (two) times daily. TAKE 1 TABLET BY MOUTH  TWICE DAILY)  ? diltiazem (CARDIZEM CD) 180 MG 24 hr capsule Take 1 capsule (180 mg total) by mouth at bedtime.  ? diltiazem (CARDIZEM) 30 MG tablet Take 1 tablet (30 mg total) by mouth as needed (tachycardia). If heart rate is over 120  ? metoprolol succinate (TOPROL-XL) 50 MG 24 hr tablet Take 1 tablet (50 mg total) by mouth at bedtime.  ?  pantoprazole (PROTONIX) 40 MG tablet Take 1 tablet (40 mg total) by mouth daily.  ? propafenone (RYTHMOL SR) 225 MG 12 hr capsule TAKE 1 CAPSULE BY MOUTH  TWICE DAILY (Patient taking differently: Take 225 mg by mouth 2 (two) times daily.)  ?  ? ?Allergies:   Codeine, Levaquin [levofloxacin], Lidocaine, and Penicillins  ? ?Social History  ? ?Socioeconomic History  ? Marital status: Married  ?  Spouse name: Not on file  ? Number of children: 2  ? Years of education: Not on file  ? Highest education level: Not on file  ?Occupational History  ? Not on file  ?Tobacco Use  ? Smoking status: Never  ? Smokeless tobacco: Never  ?Vaping Use  ? Vaping Use: Never used  ?Substance and Sexual Activity  ? Alcohol use: Never  ? Drug use: Never  ? Sexual activity: Not on file  ?Other Topics Concern  ? Not on file  ?Social History Narrative  ? Not on file  ? ?Social Determinants of Health  ? ?Financial Resource Strain: Not on file  ?Food Insecurity: Not on file  ?Transportation Needs: Not on file  ?Physical Activity: Not on file  ?Stress: Not on file  ?Social Connections: Not on file  ?  ? ?Family History: ?The patient's family history includes Atrial fibrillation in her brother, father, mother, and sister; Heart failure in her father;  Lung cancer in her mother. There is no history of Colon cancer, Pancreatic cancer, Esophageal cancer, Liver disease, or Rectal cancer. ?ROS:   ?Please see the history of present illness.    ?All 14 point review of systems negative except as described per history of present illness ? ?EKGs/Labs/Other Studies Reviewed:   ? ? ? ?Recent Labs: ?No results found for requested labs within last 8760 hours.  ?Recent Lipid Panel ?No results found for: CHOL, TRIG, HDL, CHOLHDL, VLDL, LDLCALC, LDLDIRECT ? ?Physical Exam:   ? ?VS:  BP 124/76 (BP Location: Left Arm, Patient Position: Sitting)   Pulse (!) 56   Ht 5\' 8"  (1.727 m)   Wt 223 lb 12.8 oz (101.5 kg)   SpO2 98%   BMI 34.03 kg/m?    ? ?Wt Readings from  Last 3 Encounters:  ?10/07/21 223 lb 12.8 oz (101.5 kg)  ?09/29/21 222 lb 12.8 oz (101.1 kg)  ?06/18/21 225 lb 6.4 oz (102.2 kg)  ?  ? ?GEN:  Well nourished, well developed in no acute distress ?HEENT: Normal ?NECK: No JVD; No carotid bruits ?LYMPHATICS: No lymphadenopathy ?CARDIAC: RRR, no murmurs, no rubs, no gallops ?RESPIRATORY:  Clear to auscultation without rales, wheezing or rhonchi  ?ABDOMEN: Soft, non-tender, non-distended ?MUSCULOSKELETAL:  No edema; No deformity  ?SKIN: Warm and dry ?LOWER EXTREMITIES: no swelling ?NEUROLOGIC:  Alert and oriented x 3 ?PSYCHIATRIC:  Normal affect  ? ?ASSESSMENT:   ? ?1. Persistent atrial fibrillation (HCC)   ?2. Dyspnea on exertion   ?3. History of anemia   ? ?PLAN:   ? ?In order of problems listed above: ? ?Persistent atrial fibrillation plan is to perform ablation but we waiting for GI evaluation before it. ?Dyspnea on exertion stable. ?History of anemia stable ? ? ?Medication Adjustments/Labs and Tests Ordered: ?Current medicines are reviewed at length with the patient today.  Concerns regarding medicines are outlined above.  ?No orders of the defined types were placed in this encounter. ? ?Medication changes: No orders of the defined types were placed in this encounter. ? ? ?Signed, ?08/16/21, MD, [facility] ?10/07/2021 9:12 AM    ?[facility] ?

## 2021-10-20 ENCOUNTER — Other Ambulatory Visit: Payer: Self-pay

## 2021-10-20 ENCOUNTER — Telehealth: Payer: Self-pay

## 2021-10-20 DIAGNOSIS — R1310 Dysphagia, unspecified: Secondary | ICD-10-CM

## 2021-10-20 NOTE — Telephone Encounter (Signed)
Pt was made aware of Dr. Chales Abrahams recommendations: ?Pt was scheduled for an EGD with Dilation on 10/29/2021 at 4:00: Pt made aware ?Ambulatory Referral placed in Epic: Pt made aware ?Prep Instructions were created for pt and sent to pt via my chart: pt made aware ?Hold for  Eliquis 24 hours prior sent to pt cardiologist: Dr. Bing Matter: ?Pt verbalized understanding with all questions answered.  ? ?

## 2021-10-20 NOTE — Telephone Encounter (Signed)
[facility] Pre-operative Risk Assessment  ?   ?Michelle Perkins ?07/24/1960 ?[id number] ? ?Procedure: EGD ?Anesthesia type:  MAC ?Procedure Date: 10/29/2021 ?Provider: Dr. Lyndel Safe ? ?Type of Clearance needed: Pharmacy ? ?Medication(s) needing held: Eliquis   ? ?Length of time for medication to be held: 24 hours prior ? ?Please review request and advise by either responding to this message or by sending your response to the fax # provided below. ? ?Thank you, ? ?[facility]  ?Phone: [phone] ?Fax: [phone] ? ? ?  ?

## 2021-10-20 NOTE — Telephone Encounter (Signed)
-----   Message from Lynann Bologna, MD sent at 10/19/2021 10:17 AM EDT ----- ?Regarding: EGD ?Hi Will, ? ?I was [place] of [place] 2 [place], just got back-catching up on messages. ? ?Will take care of Ms. Michelle Perkins and proceed with EGD ? ?Elby Showers  ? ? ?Viviann Spare, ?Please set Ms. Michelle Perkins for EGD with Dil at [facility] ?Hold Eliquis 24 hours prior ?RG ? ? ?----- Message ----- ?From: Regan Lemming, MD ?Sent: 09/29/2021  11:22 AM EDT ?To: Lynann Bologna, MD ? ?I saw Ms. Michelle Perkins today.  She is feeling well and is ready to have her atrial fibrillation ablation.  That being said, she is quite concerned over her esophageal issues.  She states to me that she would prefer to have her esophageal issues addressed prior to ablation.  This would be fine with me.  Let me know if you have any questions about this.  She did state that you wanted her to be ablated prior to possible endoscopy.  Thanks. ? ?

## 2021-10-21 ENCOUNTER — Other Ambulatory Visit: Payer: Self-pay | Admitting: Cardiology

## 2021-10-21 DIAGNOSIS — I480 Paroxysmal atrial fibrillation: Secondary | ICD-10-CM

## 2021-10-21 NOTE — Telephone Encounter (Signed)
Patient with diagnosis of afib on Eliquis for anticoagulation.   ? ?Procedure: EGD ?Date of procedure: 10/29/21 ? ?CHA2DS2-VASc Score = 2  ?This indicates a 2.2% annual risk of stroke. ?The patient's score is based upon: ?CHF History: 0 ?HTN History: 1 ?Diabetes History: 0 ?Stroke History: 0 ?Vascular Disease History: 0 ?Age Score: 0 ?Gender Score: 1 ?  ?  ?CrCl 22mL/min using adjusted body weight due to obesity ?Platelet count 212K ? ?Per office protocol, patient can hold Eliquis for 1 day prior to procedure. She is pending ablation however date for this is not yet set and she wishes to have GI procedure done first. ?

## 2021-10-21 NOTE — Telephone Encounter (Signed)
? ? ?  Patient Name: Michelle Perkins  ?DOB: 1960-08-15 ?MRN: [id number] ? ?Primary Cardiologist: Gypsy Balsam, MD ? ?Chart reviewed as part of pre-operative protocol coverage. Given past medical history and time since last visit, based on ACC/AHA guidelines, HELENE BERNSTEIN would be at acceptable risk for the planned procedure without further cardiovascular testing.  ? ?The patient was advised that if she develops new symptoms prior to surgery to contact our office to arrange for a follow-up visit, and she verbalized understanding. ? ?Patient with diagnosis of afib on Eliquis for anticoagulation.   ?  ?Procedure: EGD ?Date of procedure: 10/29/21 ?  ?CHA2DS2-VASc Score = 2  ?This indicates a 2.2% annual risk of stroke. ?The patient's score is based upon: ?CHF History: 0 ?HTN History: 1 ?Diabetes History: 0 ?Stroke History: 0 ?Vascular Disease History: 0 ?Age Score: 0 ?Gender Score: 1 ?  ?CrCl 16mL/min using adjusted body weight due to obesity ?Platelet count 212K ?  ?Per office protocol, patient can hold Eliquis for 1 day prior to procedure. She is pending ablation however date for this is not yet set and she wishes to have GI procedure done first. ? ?I will route this recommendation to the requesting party via Epic fax function and remove from pre-op pool. ? ?Please call with questions. ? ?Joylene Grapes, NP ?10/21/2021, 2:25 PM  ?

## 2021-10-21 NOTE — Telephone Encounter (Signed)
Prescription refill request for Eliquis received. ?Indication: Afib  ?Last office visit:10/07/21 Bing Matter)  ?Scr: 0.90 (03/25/21)  ?Age: 61 ?Weight: 101.5kg ? ?Appropriate dose and refill sent to requested pharmacy.  ?

## 2021-10-24 ENCOUNTER — Encounter: Payer: Self-pay | Admitting: Certified Registered Nurse Anesthetist

## 2021-10-29 ENCOUNTER — Encounter: Payer: Self-pay | Admitting: Gastroenterology

## 2021-10-29 ENCOUNTER — Ambulatory Visit (AMBULATORY_SURGERY_CENTER): Payer: Commercial Managed Care - PPO | Admitting: Gastroenterology

## 2021-10-29 VITALS — BP 128/72 | HR 100 | Temp 97.3°F | Resp 11 | Ht 68.0 in | Wt 229.0 lb

## 2021-10-29 DIAGNOSIS — R1310 Dysphagia, unspecified: Secondary | ICD-10-CM

## 2021-10-29 DIAGNOSIS — K222 Esophageal obstruction: Secondary | ICD-10-CM

## 2021-10-29 DIAGNOSIS — K2970 Gastritis, unspecified, without bleeding: Secondary | ICD-10-CM

## 2021-10-29 DIAGNOSIS — K200 Eosinophilic esophagitis: Secondary | ICD-10-CM

## 2021-10-29 DIAGNOSIS — B9681 Helicobacter pylori [H. pylori] as the cause of diseases classified elsewhere: Secondary | ICD-10-CM | POA: Diagnosis not present

## 2021-10-29 DIAGNOSIS — K2950 Unspecified chronic gastritis without bleeding: Secondary | ICD-10-CM | POA: Diagnosis not present

## 2021-10-29 DIAGNOSIS — K449 Diaphragmatic hernia without obstruction or gangrene: Secondary | ICD-10-CM | POA: Diagnosis not present

## 2021-10-29 DIAGNOSIS — K219 Gastro-esophageal reflux disease without esophagitis: Secondary | ICD-10-CM

## 2021-10-29 MED ORDER — PANTOPRAZOLE SODIUM 40 MG PO TBEC: DELAYED_RELEASE_TABLET | ORAL | 3 refills | Status: DC

## 2021-10-29 MED ORDER — SODIUM CHLORIDE 0.9 % IV SOLN: 500.0000 mL | Freq: Once | INTRAVENOUS | Status: DC

## 2021-10-29 NOTE — Progress Notes (Signed)
Chief Complaint: Dysphagia  Referring Provider:  Ernestene Kiel, MD      ASSESSMENT AND PLAN;   #1. GERD  #2. Esophageal dysphagia. D/d includes eso stricture, Schatzki's ring, motility disorder, EoE, pill induced esophagitis, r/o esophageal Ca or extrinsic lesions.   Plan: -Records from [facility] regarding MBS. -Protonix 40mg  po QD #30, 11 refills -EGD with dil after cardiology clearance from Dr Raliegh Ip and hold eliquis x 24hrs before.  I have discussed the risks and benefits. The risks including rare risk of perforation, bleeding, missed UGI neoplasms, risks of anesthesia/sedation. Alternatives were given. Patient is aware and agrees to proceed. All the questions were answered. This will be scheduled in upcoming days. Consent forms were given for review.  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  No melena or hematochezia.  No weight loss.  When the episodes to occur, she cannot handle her own saliva.  Denies having any cough.  Does get some shortness of breath with episodes.  Underwent MBS at [facility]- we do not have records.  However, was told that was neg. She did not have traditional barium swallow.  Denies having any fever chills or night sweats.  Has gained weight ever since diagnosed with A. fib.  Previous GI work-up  Cologuard 2022- neg. Wanted to avoid colonoscopy.  CT AP 04/2017 with contrast: Neg for acute abn.   Past Medical History:  Diagnosis Date   Dyspnea on exertion 06/06/2018   GERD (gastroesophageal reflux disease)    History of anemia    Palpitations 06/06/2018   Paroxysmal atrial fibrillation ([place]) 06/06/2018   Persistent atrial fibrillation ([place]) 07/24/2019   Snoring 11/07/2019   Vitamin D deficiency     Past Surgical History:  Procedure Laterality Date    APPENDECTOMY      Family History  Problem Relation Age of Onset   Atrial fibrillation Mother    Lung cancer Mother    Atrial fibrillation Father    Heart failure Father    Atrial fibrillation Sister    Atrial fibrillation Brother    Colon cancer Neg Hx    Pancreatic cancer Neg Hx    Esophageal cancer Neg Hx    Liver disease Neg Hx    Rectal cancer Neg Hx    Prostate cancer Neg Hx     Social History   Tobacco Use   Smoking status: Never   Smokeless tobacco: Never  Vaping Use   Vaping Use: Never used  Substance Use Topics   Alcohol use: Never   Drug use: Never    Current Outpatient Medications  Medication Sig Dispense Refill   apixaban (ELIQUIS) 5 MG TABS tablet TAKE 1 TABLET BY MOUTH  TWICE DAILY 180 tablet 1   diltiazem (CARDIZEM CD) 180 MG 24 hr capsule Take 1 capsule (180 mg total) by mouth at bedtime. 90 capsule 3   metoprolol succinate (TOPROL-XL) 50 MG 24 hr tablet Take 1 tablet (50 mg total) by mouth at bedtime. 90 tablet 3   pantoprazole (PROTONIX) 40 MG tablet Take 1 tablet (40 mg total) by mouth daily. 90 tablet 3   propafenone (RYTHMOL SR) 225 MG 12 hr capsule TAKE 1 CAPSULE BY MOUTH  TWICE DAILY (Patient taking differently: Take 225 mg by mouth 2 (two) times daily.) 180 capsule 3  diltiazem (CARDIZEM) 30 MG tablet Take 1 tablet (30 mg total) by mouth as needed (tachycardia). If heart rate is over 120 90 tablet 2   estradiol (ESTRACE) 0.1 MG/GM vaginal cream Place 1 g vaginally 2 (two) times a week. (Patient not taking: Reported on 10/29/2021)     Current Facility-Administered Medications  Medication Dose Route Frequency Provider Last Rate Last Admin   0.9 %  sodium chloride infusion  500 mL Intravenous Once Jackquline Denmark, MD        Allergies  Allergen Reactions   Codeine Nausea And Vomiting   Levaquin [Levofloxacin] Other (See Comments)    insomnia tachycardia   Lidocaine Other (See Comments)    Lower blood pressure/almost pass out   Penicillins Rash     Review of Systems:  Constitutional: Denies fever, chills, diaphoresis, appetite change and fatigue.  HEENT: Denies photophobia, eye pain, redness, hearing loss, ear pain, congestion, sore throat, rhinorrhea, sneezing, mouth sores, neck pain, neck stiffness and tinnitus.   Respiratory: Denies SOB, DOE, cough, chest tightness,  and wheezing.   Cardiovascular: Denies chest pain, palpitations and leg swelling.  Genitourinary: Denies dysuria, urgency, frequency, hematuria, flank pain and difficulty urinating.  Musculoskeletal: Denies myalgias, back pain, joint swelling, arthralgias and gait problem.  Skin: No rash.  Neurological: Denies dizziness, seizures, syncope, weakness, light-headedness, numbness and headaches.  Hematological: Denies adenopathy. Easy bruising, personal or family bleeding history  Psychiatric/Behavioral: No anxiety or depression     Physical Exam:    BP 134/61   Pulse 70   Temp (!) 97.3 F (36.3 C)   Ht 5\' 8"  (1.727 m)   Wt 229 lb (103.9 kg)   SpO2 98%   BMI 34.82 kg/m  Wt Readings from Last 3 Encounters:  10/29/21 229 lb (103.9 kg)  10/07/21 223 lb 12.8 oz (101.5 kg)  09/29/21 222 lb 12.8 oz (101.1 kg)   Constitutional:  Well-developed, in no acute distress. Psychiatric: Normal mood and affect. Behavior is normal. HEENT: Pupils normal.  Conjunctivae are normal. No scleral icterus. Neck supple.  Cardiovascular: Normal rate, regular rhythm. No edema Pulmonary/chest: Effort normal and breath sounds normal. No wheezing, rales or rhonchi. Abdominal: Soft, nondistended. Nontender. Bowel sounds active throughout. There are no masses palpable. No hepatomegaly. Rectal: Deferred Neurological: Alert and oriented to person place and time. Skin: Skin is warm and dry. No rashes noted.  Data Reviewed: I have personally reviewed following labs and imaging studies  CBC:     View : No data to display.          CMP:     View : No data to display.           GFR: CrCl cannot be calculated (No successful lab value found.). Liver Function Tests: No results for input(s): AST, ALT, ALKPHOS, BILITOT, PROT, ALBUMIN in the last 168 hours. No results for input(s): LIPASE, AMYLASE in the last 168 hours. No results for input(s): AMMONIA in the last 168 hours. Coagulation Profile: No results for input(s): INR, PROTIME in the last 168 hours. HbA1C: No results for input(s): HGBA1C in the last 72 hours. Lipid Profile: No results for input(s): CHOL, HDL, LDLCALC, TRIG, CHOLHDL, LDLDIRECT in the last 72 hours. Thyroid Function Tests: No results for input(s): TSH, T4TOTAL, FREET4, T3FREE, THYROIDAB in the last 72 hours. Anemia Panel: No results for input(s): VITAMINB12, FOLATE, FERRITIN, TIBC, IRON, RETICCTPCT in the last 72 hours.  No results found for this or any previous visit (from the past 240 hour(s)).  Radiology Studies: No results found.    Carmell Austria, MD 10/29/2021, 4:05 PM  Cc: Ernestene Kiel, MD

## 2021-10-29 NOTE — Progress Notes (Signed)
VS by DT  Pt's states no medical or surgical changes since previsit or office visit.  

## 2021-10-29 NOTE — Patient Instructions (Signed)
Pick up prescription for Protonix from pharmacy of choice.  Resume Eliquis on 10/31/21 Follow up GI clinic in 4-6 weeks - if still with dysphagia, further dilation Chew foods especially meats and breads well and eat slowly Await pathology results from biopsies taken today.   YOU HAD AN ENDOSCOPIC PROCEDURE TODAY AT THE [facility]:   Refer to the procedure report that was given to you for any specific questions about what was found during the examination.  If the procedure report does not answer your questions, please call your gastroenterologist to clarify.  If you requested that your care partner not be given the details of your procedure findings, then the procedure report has been included in a sealed envelope for you to review at your convenience later.  YOU SHOULD EXPECT: Some feelings of bloating in the abdomen. Passage of more gas than usual.  Walking can help get rid of the air that was put into your GI tract during the procedure and reduce the bloating. If you had a lower endoscopy (such as a colonoscopy or flexible sigmoidoscopy) you may notice spotting of blood in your stool or on the toilet paper. If you underwent a bowel prep for your procedure, you may not have a normal bowel movement for a few days.  Please Note:  You might notice some irritation and congestion in your nose or some drainage.  This is from the oxygen used during your procedure.  There is no need for concern and it should clear up in a day or so.  SYMPTOMS TO REPORT IMMEDIATELY:  Following upper endoscopy (EGD)  Vomiting of blood or coffee ground material  New chest pain or pain under the shoulder blades  Painful or persistently difficult swallowing  New shortness of breath  Fever of 100F or higher  Black, tarry-looking stools  For urgent or emergent issues, a gastroenterologist can be reached at any hour by calling [phone]. Do not use MyChart messaging for urgent concerns.    DIET:  We do  recommend a small meal at first, but then you may proceed to your regular diet.  Drink plenty of fluids but you should avoid alcoholic beverages for 24 hours.  ACTIVITY:  You should plan to take it easy for the rest of today and you should NOT DRIVE or use heavy machinery until tomorrow (because of the sedation medicines used during the test).    FOLLOW UP: Our staff will call the number listed on your records 48-72 hours following your procedure to check on you and address any questions or concerns that you may have regarding the information given to you following your procedure. If we do not reach you, we will leave a message.  We will attempt to reach you two times.  During this call, we will ask if you have developed any symptoms of COVID 19. If you develop any symptoms (ie: fever, flu-like symptoms, shortness of breath, cough etc.) before then, please call [phone].  If you test positive for Covid 19 in the 2 weeks post procedure, please call and report this information to [place].    If any biopsies were taken you will be contacted by phone or by letter within the next 1-3 weeks.  Please call us at [phone] if you have not heard about the biopsies in 3 weeks.    SIGNATURES/CONFIDENTIALITY: You and/or your care partner have signed paperwork which will be entered into your electronic medical record.  These signatures attest to the fact  that that the information above on your After Visit Summary has been reviewed and is understood.  Full responsibility of the confidentiality of this discharge information lies with you and/or your care-partner.

## 2021-10-29 NOTE — Progress Notes (Signed)
Called to room to assist during endoscopic procedure.  Patient ID and intended procedure confirmed with present staff. Received instructions for my participation in the procedure from the performing physician.  

## 2021-10-29 NOTE — Op Note (Signed)
[facility] Patient Name: Michelle Perkins Procedure Date: 10/29/2021 4:05 PM MRN: [id number] Endoscopist: Lynann Bologna , MD Age: 61 Referring MD:  Date of Birth: [birth year] Gender: Female Account #: [id number] Procedure:                Upper GI endoscopy Indications:              Dysphagia Medicines:                Monitored Anesthesia Care Procedure:                Pre-Anesthesia Assessment:                           - Prior to the procedure, a History and Physical                            was performed, and patient medications and                            allergies were reviewed. The patient's tolerance of                            previous anesthesia was also reviewed. The risks                            and benefits of the procedure and the sedation                            options and risks were discussed with the patient.                            All questions were answered, and informed consent                            was obtained. Prior Anticoagulants: The patient has                            taken Eliquis (apixaban), last dose was 1 day prior                            to procedure. ASA Grade Assessment: II - A patient                            with mild systemic disease. After reviewing the                            risks and benefits, the patient was deemed in                            satisfactory condition to undergo the procedure.                           After obtaining informed consent, the endoscope was  passed under direct vision. Throughout the                            procedure, the patient's blood pressure, pulse, and                            oxygen saturations were monitored continuously. The                            GIF [id number] #[id number] was introduced through the                            mouth, and advanced to the second part of duodenum.                            The upper GI endoscopy was accomplished  without                            difficulty. The patient tolerated the procedure                            well. Scope In: Scope Out: Findings:                 Multiple linear and circular furrows throughout the                            esophagus with one benign-appearing, intrinsic                            severe (stenosis; an endoscope cannot pass)                            stenosis was found 35 cm from the incisors. This                            stenosis measured 8 mm (inner diameter) x less than                            one cm (in length). The stenosis was traversed                            after dilation. A TTS dilator was passed through                            the scope. Dilation with an 04-19-12 mm balloon                            dilator was performed to 13 mm. Thereafter the                            scope could be passed beyond and EGD completed.  Distal esophageal stricture was also dilated using                            a 13.5 mm balloon x 1 minute. The dilation site was                            examined and showed moderate mucosal disruption and                            moderate improvement in luminal narrowing. Biopsies                            were obtained from the proximal and distal                            esophagus with cold forceps for histology of                            suspected eosinophilic esophagitis. Thereafter, the                            entire esophagus was dilated with 46 [place].                           A 2 cm hiatal hernia was present.                           The entire examined stomach was normal. Biopsies                            were taken with a cold forceps for histology.                           The examined duodenum was normal. Complications:            No immediate complications. Estimated Blood Loss:     Estimated blood loss: Minimal. Impression:               -  Benign-appearing esophageal stenosis. Dilated.                            Biopsied.                           - Esophageal findings highly s/o eosinophilic                            esophagitis.                           - 2 cm hiatal hernia. Recommendation:           - Patient has a contact number available for                            emergencies. The signs and symptoms of potential  delayed complications were discussed with the                            patient. Return to normal activities tomorrow.                            Written discharge instructions were provided to the                            patient.                           - Resume previous diet.                           - Use Protonix (pantoprazole) 40 mg PO BID for 2                            weeks, then QD indefinitely.                           - Resume Eliquis (apixaban) at prior dose day after                            tomorrow (in 48 hrs)                           - Follow biopsies.                           - FU GI clinic in 4 to 6 weeks. If still with                            dysphagia, further dilatation.                           - Chew foods especially meats and breads well and                            eat slowly.                           - The findings and recommendations were discussed                            with the patient's family. Lynann Bologna, MD 10/29/2021 4:44:29 PM This report has been signed electronically.

## 2021-10-29 NOTE — Progress Notes (Signed)
A/ox3, pleased with MAC, report to RN 

## 2021-10-30 ENCOUNTER — Telehealth: Payer: Self-pay

## 2021-10-30 ENCOUNTER — Telehealth: Payer: Self-pay | Admitting: *Deleted

## 2021-10-30 NOTE — Telephone Encounter (Signed)
Recommendation from recent Upper Endoscopy request Follow Up GI Clinic in 4 -6 weeks: Pt was made aware of recommendations. Pt stated that her schedule is not out yet for that time but should have it in about a week. Pt stated that she will call our office back to schedule an appointment. Pt verbalized understanding with all questions answered.

## 2021-10-30 NOTE — Telephone Encounter (Signed)
  Follow up Call-     10/29/2021    3:22 PM  Call back number  Post procedure Call Back phone  # [phone]  Permission to leave phone message Yes     Patient questions:  Do you have a fever, pain , or abdominal swelling? No. Pain Score  0 *  Have you tolerated food without any problems? Yes.    Have you been able to return to your normal activities? Yes.    Do you have any questions about your discharge instructions: Diet   No. Medications  No. Follow up visit  No.  Do you have questions or concerns about your Care? No.  Actions: * If pain score is 4 or above: No action needed, pain <4.

## 2021-11-05 ENCOUNTER — Telehealth: Payer: Self-pay | Admitting: Pharmacy Technician

## 2021-11-05 ENCOUNTER — Other Ambulatory Visit (HOSPITAL_COMMUNITY): Payer: Self-pay

## 2021-11-05 NOTE — Telephone Encounter (Signed)
Mailbox is full. Called pt to see if she knows her script was sent to mail order because it says 5-24 it was sent there. [facility] mail service

## 2021-11-05 NOTE — Telephone Encounter (Signed)
Patient Advocate Encounter  Received notification from [employer] that prior authorization for PROTONIX 40MG  is required.   PA submitted on 05.31.23 [facility] Status is pending   [facility] will continue to follow  [facility] [place], CPhT Patient Advocate Phone: [phone]

## 2021-11-05 NOTE — Telephone Encounter (Signed)
Received a fax regarding Prior Authorization from [facility] for PANTOPRAZOLE 40MG . Authorization has been CANCELLED because PT HAS REACHED MAX NUMBER OF FILLS FOR RETAIL.   All future fills will need to be from mail order pharmacy.

## 2021-11-22 ENCOUNTER — Encounter: Payer: Self-pay | Admitting: Gastroenterology

## 2021-11-22 NOTE — Progress Notes (Signed)
1) Protonix 40 mg 2 times a day, then continue protonix.  2) Pepto Bismol 2 tabs (262 mg each) 4 times a day x 14 d 3) Metronidazole 250 mg 4 times a day x 14 d 4) doxycycline 100 mg 2 times a day x 14 d 4 weeks after treatment completed,can hold PPI x10 days in order tocheck H. Pylori stool antigen to confirm eradication (must be off acid suppression therapy).  Pl make appt with Dr Lucie Leather for EoE

## 2021-11-24 ENCOUNTER — Other Ambulatory Visit: Payer: Self-pay | Admitting: Gastroenterology

## 2021-11-24 ENCOUNTER — Other Ambulatory Visit: Payer: Self-pay

## 2021-11-24 DIAGNOSIS — B9681 Helicobacter pylori [H. pylori] as the cause of diseases classified elsewhere: Secondary | ICD-10-CM

## 2021-11-24 MED ORDER — PANTOPRAZOLE SODIUM 40 MG PO TBEC: 40.0000 mg | DELAYED_RELEASE_TABLET | Freq: Two times a day (BID) | ORAL | 0 refills | Status: DC

## 2021-11-24 MED ORDER — DOXYCYCLINE HYCLATE 50 MG PO CAPS: 100.0000 mg | ORAL_CAPSULE | Freq: Two times a day (BID) | ORAL | 0 refills | Status: AC

## 2021-11-24 MED ORDER — METRONIDAZOLE 250 MG PO TABS: 250.0000 mg | ORAL_TABLET | Freq: Four times a day (QID) | ORAL | 0 refills | Status: AC

## 2021-11-24 MED ORDER — BISMUTH SUBSALICYLATE 262 MG PO CHEW: 524.0000 mg | CHEWABLE_TABLET | Freq: Two times a day (BID) | ORAL | 0 refills | Status: AC

## 2021-11-24 NOTE — Telephone Encounter (Signed)
PA needed for 2 a day Protonix 40mg . Please help

## 2021-11-25 ENCOUNTER — Other Ambulatory Visit: Payer: Self-pay

## 2021-11-25 ENCOUNTER — Telehealth: Payer: Self-pay | Admitting: *Deleted

## 2021-11-25 DIAGNOSIS — B9681 Helicobacter pylori [H. pylori] as the cause of diseases classified elsewhere: Secondary | ICD-10-CM

## 2021-11-25 NOTE — Telephone Encounter (Signed)
Attempted to reach pt to discuss ablation scheduling. Unable to leave message, voicemail full

## 2021-11-27 ENCOUNTER — Other Ambulatory Visit (HOSPITAL_COMMUNITY): Payer: Self-pay

## 2021-11-27 ENCOUNTER — Telehealth: Payer: Self-pay | Admitting: Pharmacy Technician

## 2021-11-30 ENCOUNTER — Other Ambulatory Visit (HOSPITAL_COMMUNITY): Payer: Self-pay

## 2022-01-01 ENCOUNTER — Other Ambulatory Visit: Payer: Self-pay | Admitting: Gastroenterology

## 2022-01-01 ENCOUNTER — Encounter: Payer: Self-pay | Admitting: Gastroenterology

## 2022-01-01 ENCOUNTER — Other Ambulatory Visit (HOSPITAL_COMMUNITY): Payer: Self-pay

## 2022-01-01 ENCOUNTER — Telehealth: Payer: Self-pay

## 2022-01-01 DIAGNOSIS — B9681 Helicobacter pylori [H. pylori] as the cause of diseases classified elsewhere: Secondary | ICD-10-CM

## 2022-01-01 NOTE — Telephone Encounter (Signed)
Can I get a PA done for this patient for BID dosing of Protonix please?

## 2022-01-01 NOTE — Telephone Encounter (Signed)
Patient Advocate Encounter   Received notification that prior authorization is required for Pantoprazole 40MG  (twice daily dosing)  Key Submitted: 01/01/2022 Status is pending  01/03/2022, CPhT Rx Patient Advocate Specialist Phone: [phone]

## 2022-01-01 NOTE — Telephone Encounter (Signed)
Patient Advocate Encounter  Prior Authorization for Pantoprazole 40MG  has been approved.   Effective: 01/01/2022 to 01/02/2023.  01/04/2023, CPhT Rx Patient Advocate Specialist Phone: [phone]

## 2022-01-13 ENCOUNTER — Encounter: Payer: Self-pay | Admitting: Cardiology

## 2022-01-13 ENCOUNTER — Other Ambulatory Visit: Payer: Commercial Managed Care - PPO

## 2022-01-13 ENCOUNTER — Ambulatory Visit: Payer: Commercial Managed Care - PPO | Admitting: Cardiology

## 2022-01-13 VITALS — BP 122/66 | HR 88 | Ht 68.0 in | Wt 226.4 lb

## 2022-01-13 DIAGNOSIS — I4892 Unspecified atrial flutter: Secondary | ICD-10-CM | POA: Diagnosis not present

## 2022-01-13 DIAGNOSIS — Z862 Personal history of diseases of the blood and blood-forming organs and certain disorders involving the immune mechanism: Secondary | ICD-10-CM

## 2022-01-13 DIAGNOSIS — R0609 Other forms of dyspnea: Secondary | ICD-10-CM

## 2022-01-13 NOTE — Progress Notes (Signed)
Cardiology Office Note:    Date:  01/13/2022   ID:  Michelle Perkins, DOB 17-Jul-1960, MRN [id number]  PCP:  Philemon Kingdom, MD  Cardiologist:  Gypsy Balsam, MD    Referring MD: Philemon Kingdom, MD   Chief Complaint  Patient presents with   Follow-up    History of Present Illness:    Michelle Perkins is a 61 y.o. female   past medical history significant for paroxysmal atrial flutter she had 2 cardioversions performed last cardioversion in April of last year stay normal rhythm for a month and then flew back to atrial flutter/fibrillation.  She does have a discussion with the EP team and she is already contemplating having ablation done she had some doubt about is still debating but I think is close to commit to it.   She still undecided about ambulation.  Actually today she tells me she probably not can do it still want to have some time to think about it.  Overall doing well she was diagnosed with H. pylori she did actually have stretching of her esophagus since that time doing well but did have difficult tolerating treatment for H. pylori  Past Medical History:  Diagnosis Date   Dyspnea on exertion 06/06/2018   GERD (gastroesophageal reflux disease)    History of anemia    Palpitations 06/06/2018   Paroxysmal atrial fibrillation (HCC) 06/06/2018   Persistent atrial fibrillation (HCC) 07/24/2019   Snoring 11/07/2019   Vitamin D deficiency     Past Surgical History:  Procedure Laterality Date   APPENDECTOMY      Current Medications: Current Meds  Medication Sig   apixaban (ELIQUIS) 5 MG TABS tablet TAKE 1 TABLET BY MOUTH  TWICE DAILY   diltiazem (CARDIZEM CD) 180 MG 24 hr capsule Take 1 capsule (180 mg total) by mouth at bedtime.   diltiazem (CARDIZEM) 30 MG tablet Take 1 tablet (30 mg total) by mouth as needed (tachycardia). If heart rate is over 120   estradiol (ESTRACE) 0.1 MG/GM vaginal cream Place 1 g vaginally 2 (two) times a week.   metoprolol succinate  (TOPROL-XL) 50 MG 24 hr tablet Take 1 tablet (50 mg total) by mouth at bedtime.   pantoprazole (PROTONIX) 40 MG tablet Take 1 tablet (40 mg total) by mouth daily.   pantoprazole (PROTONIX) 40 MG tablet 40 mg PO BID for 2 weeks, then QD indefinitely   pantoprazole (PROTONIX) 40 MG tablet TAKE 1 TABLET (40 MG TOTAL) BY MOUTH TWICE A DAY FOR 14 DAYS   propafenone (RYTHMOL SR) 225 MG 12 hr capsule TAKE 1 CAPSULE BY MOUTH  TWICE DAILY (Patient taking differently: Take 225 mg by mouth 2 (two) times daily.)     Allergies:   Codeine, Levaquin [levofloxacin], Lidocaine, and Penicillins   Social History   Socioeconomic History   Marital status: Married    Spouse name: Not on file   Number of children: 2   Years of education: Not on file   Highest education level: Not on file  Occupational History   Not on file  Tobacco Use   Smoking status: Never   Smokeless tobacco: Never  Vaping Use   Vaping Use: Never used  Substance and Sexual Activity   Alcohol use: Never   Drug use: Never   Sexual activity: Not on file  Other Topics Concern   Not on file  Social History Narrative   Not on file   Social Determinants of Health   Financial Resource Strain: Not on  file  Food Insecurity: Not on file  Transportation Needs: Not on file  Physical Activity: Not on file  Stress: Not on file  Social Connections: Not on file     Family History: The patient's family history includes Atrial fibrillation in her brother, father, mother, and sister; Heart failure in her father; Lung cancer in her mother. There is no history of Colon cancer, Pancreatic cancer, Esophageal cancer, Liver disease, Rectal cancer, or Prostate cancer. ROS:   Please see the history of present illness.    All 14 point review of systems negative except as described per history of present illness  EKGs/Labs/Other Studies Reviewed:      Recent Labs: No results found for requested labs within last 365 days.  Recent Lipid  Panel No results found for: "CHOL", "TRIG", "HDL", "CHOLHDL", "VLDL", "LDLCALC", "LDLDIRECT"  Physical Exam:    VS:  BP 122/66 (BP Location: Left Arm, Patient Position: Sitting)   Pulse 88   Ht 5\' 8"  (1.727 m)   Wt 226 lb 6.4 oz (102.7 kg)   SpO2 97%   BMI 34.42 kg/m     Wt Readings from Last 3 Encounters:  01/13/22 226 lb 6.4 oz (102.7 kg)  10/29/21 229 lb (103.9 kg)  10/07/21 223 lb 12.8 oz (101.5 kg)     GEN:  Well nourished, well developed in no acute distress HEENT: Normal NECK: No JVD; No carotid bruits LYMPHATICS: No lymphadenopathy CARDIAC: RRR, no murmurs, no rubs, no gallops RESPIRATORY:  Clear to auscultation without rales, wheezing or rhonchi  ABDOMEN: Soft, non-tender, non-distended MUSCULOSKELETAL:  No edema; No deformity  SKIN: Warm and dry LOWER EXTREMITIES: no swelling NEUROLOGIC:  Alert and oriented x 3 PSYCHIATRIC:  Normal affect   ASSESSMENT:    1. Paroxysmal atrial flutter (HCC)   2. Dyspnea on exertion   3. History of anemia    PLAN:    In order of problems listed above:  Atypical atrial flutter that she is in right now.  She is anticoagulant which I continue.  We question usefulness of Rythmol right now since she is not maintaining sinus rhythm I want her to think about what she wants to do options are simple rate control strategy which make me somewhat uncomfortable such a young lady versus rhythm control strategy.  I told her we can meet again in about 2 to 3 months to make a decision about it in the meantime we will continue present management. Dyspnea on exertion denies having any History of anemia stable   Medication Adjustments/Labs and Tests Ordered: Current medicines are reviewed at length with the patient today.  Concerns regarding medicines are outlined above.  No orders of the defined types were placed in this encounter.  Medication changes: No orders of the defined types were placed in this encounter.   Signed, 12/07/21, MD, [facility] 01/13/2022 1:49 PM    [facility]

## 2022-01-13 NOTE — Patient Instructions (Signed)
Medication Instructions:  Your physician recommends that you continue on your current medications as directed. Please refer to the Current Medication list given to you today.  *If you need a refill on your cardiac medications before your next appointment, please call your pharmacy*   Lab Work: None If you have labs (blood work) drawn today and your tests are completely normal, you will receive your results only by: MyChart Message (if you have MyChart) OR A paper copy in the mail If you have any lab test that is abnormal or we need to change your treatment, we will call you to review the results.   Testing/Procedures: None   Follow-Up: At [facility], you and your health needs are our priority.  As part of our continuing mission to provide you with exceptional heart care, we have created designated Provider Care Teams.  These Care Teams include your primary Cardiologist (physician) and Advanced Practice Providers (APPs -  Physician Assistants and Nurse Practitioners) who all work together to provide you with the care you need, when you need it.  We recommend signing up for the patient portal called "MyChart".  Sign up information is provided on this After Visit Summary.  MyChart is used to connect with patients for Virtual Visits (Telemedicine).  Patients are able to view lab/test results, encounter notes, upcoming appointments, etc.  Non-urgent messages can be sent to your provider as well.   To learn more about what you can do with MyChart, go to [email or web address].    Your next appointment:   2 month(s)  The format for your next appointment:   In Person  Provider:   Gypsy Balsam, MD    Other Instructions   Important Information About Sugar

## 2022-01-14 ENCOUNTER — Other Ambulatory Visit: Payer: Commercial Managed Care - PPO

## 2022-01-14 DIAGNOSIS — B9681 Helicobacter pylori [H. pylori] as the cause of diseases classified elsewhere: Secondary | ICD-10-CM

## 2022-01-16 LAB — H. PYLORI ANTIGEN, STOOL: H pylori Ag, Stl: NEGATIVE

## 2022-03-05 ENCOUNTER — Other Ambulatory Visit: Payer: Self-pay | Admitting: Cardiology

## 2022-03-06 NOTE — Telephone Encounter (Signed)
Rx refill sent to pharmacy. 

## 2022-03-18 ENCOUNTER — Ambulatory Visit: Payer: Commercial Managed Care - PPO | Admitting: Gastroenterology

## 2022-03-18 ENCOUNTER — Encounter: Payer: Self-pay | Admitting: Gastroenterology

## 2022-03-18 VITALS — BP 130/82 | HR 98 | Ht 68.0 in | Wt 233.0 lb

## 2022-03-18 DIAGNOSIS — R1319 Other dysphagia: Secondary | ICD-10-CM

## 2022-03-18 DIAGNOSIS — K219 Gastro-esophageal reflux disease without esophagitis: Secondary | ICD-10-CM | POA: Diagnosis not present

## 2022-03-18 MED ORDER — PANTOPRAZOLE SODIUM 40 MG PO TBEC: 40.0000 mg | DELAYED_RELEASE_TABLET | Freq: Every day | ORAL | 5 refills | Status: DC

## 2022-03-18 NOTE — Progress Notes (Signed)
Chief Complaint: Dysphagia  Referring Provider:  Philemon Kingdom, MD      ASSESSMENT AND PLAN;   #1. GERD with small HH  #2. 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 HP gastritis s/p quadruple drug therapy.  Neg posttreatment stool HP antigen.   Plan:  -Continue Protonix 40mg  po QD #90, 4RF. 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  Also has small HH.  Gastric biopsies were positive for HP, treated successfully with quadruple drug therapy.  She is maintained on Protonix daily  No further dysphagia.  Feels 100% better.  Wants to hold off on repeat EGD.  Has appointment with Dr. 77 in coming days.  Has gained weight.  Eating much better.   Previous GI work-up:  EGD 10/29/2021 - Benign-appearing esophageal stenosis. Dilated to 13.5 mm. Bx- EoE (17/HPF) - Esophageal findings highly s/o eosinophilic esophagitis. - 2 cm hiatal hernia. - Gastric Bx- + HP  Cologuard 2022- neg. Wants to avoid colonoscopy. (Dr 2023)  CT AP 04/2017 with contrast: Neg for acute abn.  Wt Readings from Last 3 Encounters:  03/18/22 233 lb (105.7 kg)  01/13/22 226 lb 6.4 oz (102.7 kg)  10/29/21 229 lb (103.9 kg)    Past Medical History:  Diagnosis Date   Dyspnea on exertion 06/06/2018   GERD (gastroesophageal reflux disease)    History of anemia    Palpitations 06/06/2018   Paroxysmal atrial fibrillation (HCC) 06/06/2018   Persistent atrial fibrillation (HCC) 07/24/2019   Snoring 11/07/2019   Vitamin D deficiency     Past Surgical History:  Procedure Laterality Date   APPENDECTOMY      Family History  Problem Relation Age of Onset   Atrial fibrillation Mother    Lung cancer Mother    Atrial fibrillation Father    Heart failure Father    Atrial fibrillation Sister    Atrial fibrillation Brother    Colon cancer Neg Hx     Pancreatic cancer Neg Hx    Esophageal cancer Neg Hx    Liver disease Neg Hx    Rectal cancer Neg Hx    Prostate cancer Neg Hx     Social History   Tobacco Use   Smoking status: Never   Smokeless tobacco: Never  Vaping Use   Vaping Use: Never used  Substance Use Topics   Alcohol use: Never   Drug use: Never    Current Outpatient Medications  Medication Sig Dispense Refill   apixaban (ELIQUIS) 5 MG TABS tablet TAKE 1 TABLET BY MOUTH  TWICE DAILY 180 tablet 1   diltiazem (CARDIZEM CD) 180 MG 24 hr capsule TAKE 1 CAPSULE BY MOUTH AT  BEDTIME 90 capsule 3   diltiazem (CARDIZEM) 30 MG tablet Take 1 tablet (30 mg total) by mouth as needed (tachycardia). If heart rate is over 120 90 tablet 2   estradiol (ESTRACE) 0.1 MG/GM vaginal cream Place 1 g vaginally 2 (two) times a week.     metoprolol succinate (TOPROL-XL) 50 MG 24 hr tablet TAKE 1 TABLET BY MOUTH AT  BEDTIME 90 tablet 3   pantoprazole (PROTONIX) 40 MG tablet Take 1 tablet (40 mg total) by mouth daily. 90 tablet 3   propafenone (RYTHMOL SR) 225 MG 12 hr capsule TAKE 1 CAPSULE BY MOUTH  TWICE DAILY (  Patient taking differently: Take 225 mg by mouth 2 (two) times daily.) 180 capsule 3   No current facility-administered medications for this visit.    Allergies  Allergen Reactions   Codeine Nausea And Vomiting   Levaquin [Levofloxacin] Other (See Comments)    insomnia tachycardia   Lidocaine Other (See Comments)    Lower blood pressure/almost pass out   Penicillins Rash    Review of Systems:  neg     Physical Exam:    BP 130/82   Pulse 98   Ht 5\' 8"  (1.727 m)   Wt 233 lb (105.7 kg)   BMI 35.43 kg/m  Wt Readings from Last 3 Encounters:  03/18/22 233 lb (105.7 kg)  01/13/22 226 lb 6.4 oz (102.7 kg)  10/29/21 229 lb (103.9 kg)   Constitutional:  Well-developed, in no acute distress. Psychiatric: Normal mood and affect. Behavior is normal. Abdominal: Soft, nondistended. Nontender. Bowel sounds active throughout.  There are no masses palpable. No hepatomegaly. Rectal: Deferred Neurological: Alert and oriented to person place and time.    Carmell Austria, MD 03/18/2022, 11:09 AM  Cc: Ernestene Kiel, MD

## 2022-03-18 NOTE — Patient Instructions (Signed)
_______________________________________________________  If you are age 62 or older, your body mass index should be between 23-30. Your Body mass index is 35.43 kg/m. If this is out of the aforementioned range listed, please consider follow up with your Primary Care Provider.  If you are age 26 or younger, your body mass index should be between 19-25. Your Body mass index is 35.43 kg/m. If this is out of the aformentioned range listed, please consider follow up with your Primary Care Provider.   ________________________________________________________  The [facility] providers would like to encourage you to use [facility] to communicate with providers for non-urgent requests or questions.  Due to long hold times on the telephone, sending your provider a message by [facility] may be a faster and more efficient way to get a response.  Please allow 48 business hours for a response.  Please remember that this is for non-urgent requests.  _______________________________________________________  We have sent the following medications to your pharmacy for you to pick up at your convenience: Protonix Mail order  Please call with any questions or concerns.  Thank you,  Dr. Jackquline Denmark

## 2022-03-26 ENCOUNTER — Ambulatory Visit: Payer: Commercial Managed Care - PPO | Attending: Cardiology | Admitting: Cardiology

## 2022-03-26 ENCOUNTER — Encounter: Payer: Self-pay | Admitting: Cardiology

## 2022-03-26 VITALS — BP 142/80 | HR 62 | Ht 68.0 in | Wt 233.0 lb

## 2022-03-26 DIAGNOSIS — I10 Essential (primary) hypertension: Secondary | ICD-10-CM | POA: Diagnosis not present

## 2022-03-26 DIAGNOSIS — R0600 Dyspnea, unspecified: Secondary | ICD-10-CM | POA: Diagnosis not present

## 2022-03-26 DIAGNOSIS — R002 Palpitations: Secondary | ICD-10-CM

## 2022-03-26 DIAGNOSIS — R0609 Other forms of dyspnea: Secondary | ICD-10-CM

## 2022-03-26 DIAGNOSIS — I480 Paroxysmal atrial fibrillation: Secondary | ICD-10-CM

## 2022-03-26 NOTE — Progress Notes (Signed)
Cardiology Office Note:    Date:  03/26/2022   ID:  Michelle Perkins, DOB 1960-08-27, MRN [id number]  PCP:  Philemon Kingdom, MD  Cardiologist:  Gypsy Balsam, MD    Referring MD: Philemon Kingdom, MD   Chief Complaint  Patient presents with   Follow-up    History of Present Illness:    Michelle Perkins is a 61 y.o. female  past medical history significant for paroxysmal atrial flutter she had 2 cardioversions performed last cardioversion in April of last year stay normal rhythm for a month and then flew back to atrial flutter/fibrillation.  She does have a discussion with the EP team and she is already contemplating having ablation done she had some doubt about is still debating but I think is close to commit to it.   Comes today 2 months of follow-up.  Overall doing well.  Denies of any chest pain tightness squeezing pressure burning chest, again we had a long discussion about potential ablation she still reluctant she wants to think will be more about it I told her we cannot wait for it and definitely she understands.  She is a Engineer, civil (consulting).  Today she tells me that she is scared of potential proarrhythmic of atrial fibrillation after ablation.  Past Medical History:  Diagnosis Date   Dyspnea on exertion 06/06/2018   GERD (gastroesophageal reflux disease)    History of anemia    Palpitations 06/06/2018   Paroxysmal atrial fibrillation (HCC) 06/06/2018   Persistent atrial fibrillation (HCC) 07/24/2019   Snoring 11/07/2019   Vitamin D deficiency     Past Surgical History:  Procedure Laterality Date   APPENDECTOMY      Current Medications: Current Meds  Medication Sig   apixaban (ELIQUIS) 5 MG TABS tablet TAKE 1 TABLET BY MOUTH  TWICE DAILY (Patient taking differently: Take 5 mg by mouth 2 (two) times daily. TAKE 1 TABLET BY MOUTH  TWICE DAILY)   diltiazem (CARDIZEM CD) 180 MG 24 hr capsule TAKE 1 CAPSULE BY MOUTH AT  BEDTIME   diltiazem (CARDIZEM) 30 MG tablet Take 1 tablet (30  mg total) by mouth as needed (tachycardia). If heart rate is over 120 (Patient taking differently: Take 30 mg by mouth daily.)   estradiol (ESTRACE) 0.1 MG/GM vaginal cream Place 1 g vaginally 2 (two) times a week.   metoprolol succinate (TOPROL-XL) 50 MG 24 hr tablet TAKE 1 TABLET BY MOUTH AT  BEDTIME   pantoprazole (PROTONIX) 40 MG tablet Take 1 tablet (40 mg total) by mouth daily.   propafenone (RYTHMOL SR) 225 MG 12 hr capsule TAKE 1 CAPSULE BY MOUTH  TWICE DAILY (Patient taking differently: Take 225 mg by mouth 2 (two) times daily.)     Allergies:   Codeine, Levaquin [levofloxacin], Lidocaine, and Penicillins   Social History   Socioeconomic History   Marital status: Married    Spouse name: Not on file   Number of children: 2   Years of education: Not on file   Highest education level: Not on file  Occupational History   Not on file  Tobacco Use   Smoking status: Never   Smokeless tobacco: Never  Vaping Use   Vaping Use: Never used  Substance and Sexual Activity   Alcohol use: Never   Drug use: Never   Sexual activity: Not on file  Other Topics Concern   Not on file  Social History Narrative   Not on file   Social Determinants of Health   Financial Resource  Strain: Not on file  Food Insecurity: Not on file  Transportation Needs: Not on file  Physical Activity: Not on file  Stress: Not on file  Social Connections: Not on file     Family History: The patient's family history includes Atrial fibrillation in her brother, father, mother, and sister; Heart failure in her father; Lung cancer in her mother. There is no history of Colon cancer, Pancreatic cancer, Esophageal cancer, Liver disease, Rectal cancer, or Prostate cancer. ROS:   Please see the history of present illness.    All 14 point review of systems negative except as described per history of present illness  EKGs/Labs/Other Studies Reviewed:      Recent Labs: No results found for requested labs within  last 365 days.  Recent Lipid Panel No results found for: "CHOL", "TRIG", "HDL", "CHOLHDL", "VLDL", "LDLCALC", "LDLDIRECT"  Physical Exam:    VS:  BP (!) 142/80 (BP Location: Left Arm, Patient Position: Sitting)   Pulse 62   Ht 5\' 8"  (1.727 m)   Wt 233 lb (105.7 kg)   SpO2 96%   BMI 35.43 kg/m     Wt Readings from Last 3 Encounters:  03/26/22 233 lb (105.7 kg)  03/18/22 233 lb (105.7 kg)  01/13/22 226 lb 6.4 oz (102.7 kg)     GEN:  Well nourished, well developed in no acute distress HEENT: Normal NECK: No JVD; No carotid bruits LYMPHATICS: No lymphadenopathy CARDIAC: RRR, no murmurs, no rubs, no gallops RESPIRATORY:  Clear to auscultation without rales, wheezing or rhonchi  ABDOMEN: Soft, non-tender, non-distended MUSCULOSKELETAL:  No edema; No deformity  SKIN: Warm and dry LOWER EXTREMITIES: no swelling NEUROLOGIC:  Alert and oriented x 3 PSYCHIATRIC:  Normal affect   ASSESSMENT:    1. Paroxysmal atrial fibrillation (HCC)   2. Dyspnea on exertion   3. Palpitations   4. Essential hypertension    PLAN:    In order of problems listed above:  Paroxysmal atrial fibrillation now persistent atrial fibrillation.  Continue anticoagulation.  Rate controlled she is on Rythmol however clearly does not work I asked her) to make a decision which way she wants to proceed the send rhythm control strategy or rate control strategy.  She said she needed tell me more time to think about it.  We will wait for her decision Dyspnea exertion: Denies having any. Patient denies having any    Medication Adjustments/Labs and Tests Ordered: Current medicines are reviewed at length with the patient today.  Concerns regarding medicines are outlined above.  No orders of the defined types were placed in this encounter.  Medication changes: No orders of the defined types were placed in this encounter.   Signed, Park Liter, MD, [facility] 03/26/2022 11:01 AM    [place]

## 2022-03-26 NOTE — Patient Instructions (Signed)

## 2022-04-09 ENCOUNTER — Other Ambulatory Visit: Payer: Self-pay | Admitting: Cardiology

## 2022-04-09 DIAGNOSIS — I480 Paroxysmal atrial fibrillation: Secondary | ICD-10-CM

## 2022-04-09 NOTE — Telephone Encounter (Signed)
Prescription refill request for Eliquis received. Indication:Afib Last office visit:10/23 KFM:MCRFV labs at next appt Age: 61 Weight:105.7 kg  Prescription refilled

## 2022-04-16 ENCOUNTER — Ambulatory Visit: Payer: Self-pay | Admitting: Allergy and Immunology

## 2022-06-19 ENCOUNTER — Other Ambulatory Visit: Payer: Self-pay | Admitting: Cardiology

## 2022-06-19 DIAGNOSIS — I480 Paroxysmal atrial fibrillation: Secondary | ICD-10-CM

## 2022-06-22 NOTE — Telephone Encounter (Addendum)
Prescription refill request for Eliquis received. Indication: PAF Last office visit:03/26/22  Nelta Numbers MD Scr: 0.90 on 03/25/21 Age: 62 Weight: 105.7kg  Has appt with Dr Agustin Cree 1/24 for f/u and labs. Based on above findings Eliquis 5mg  twice daily is the appropriate dose.  Refill approved x 1.

## 2022-07-07 ENCOUNTER — Ambulatory Visit: Payer: Commercial Managed Care - PPO | Admitting: Cardiology

## 2022-07-25 ENCOUNTER — Other Ambulatory Visit: Payer: Self-pay | Admitting: Cardiology

## 2022-08-30 ENCOUNTER — Other Ambulatory Visit: Payer: Self-pay | Admitting: Cardiology

## 2022-08-30 DIAGNOSIS — I480 Paroxysmal atrial fibrillation: Secondary | ICD-10-CM

## 2022-08-31 NOTE — Telephone Encounter (Signed)
Prescription refill request for Eliquis received. Indication: AF Last office visit: 03/26/22  Nelta Numbers MD Scr: 0.90 on 04/25/21  (Has appt with Dr Raliegh Ip on 09/15/22.  Labs have been request at that time) Age: 62 Weight: 105.7kg  Based on above findings Eliquis 5mg  twice daily is the appropriate dose.  Refill approved x 1.

## 2022-09-15 ENCOUNTER — Encounter: Payer: Self-pay | Admitting: Cardiology

## 2022-09-15 ENCOUNTER — Ambulatory Visit: Payer: Commercial Managed Care - PPO | Attending: Cardiology | Admitting: Cardiology

## 2022-09-15 VITALS — BP 158/72 | HR 100 | Ht 68.0 in | Wt 233.0 lb

## 2022-09-15 DIAGNOSIS — I483 Typical atrial flutter: Secondary | ICD-10-CM

## 2022-09-15 DIAGNOSIS — R002 Palpitations: Secondary | ICD-10-CM

## 2022-09-15 DIAGNOSIS — R0600 Dyspnea, unspecified: Secondary | ICD-10-CM | POA: Diagnosis not present

## 2022-09-15 DIAGNOSIS — R0609 Other forms of dyspnea: Secondary | ICD-10-CM

## 2022-09-15 DIAGNOSIS — I480 Paroxysmal atrial fibrillation: Secondary | ICD-10-CM

## 2022-09-15 NOTE — Progress Notes (Unsigned)
Cardiology Office Note:    Date:  09/15/2022   ID:  Michelle Perkins, DOB Oct 11, 1960, MRN [id number]  PCP:  Philemon Kingdom, MD  Cardiologist:  Gypsy Balsam, MD    Referring MD: Philemon Kingdom, MD   Chief Complaint  Patient presents with   Follow-up    History of Present Illness:    Michelle Perkins is a 62 y.o. female past medical history significant for paroxysmal atrial fibrillation, paroxysmal flutter, she did have forwarded to cardioversion performed.  In spite of that she went back to atrial flutter.  She has been put on Rythmol and kept on this antiarrhythmic medication however she went back to atrial flutter couple months ago and keep staying in it.  She was scheduled to have atrial fibrillation/atrial flutter ablation however is reluctant to do it she heard about all potential complication of this procedure and she is scared of having this done.  We had a long discussion about the situation today overall she is feeling fine.  She would like to consider different antiarrhythmic therapy.  Therefore, I will refer her back to her electrophysiologist Dr. Elberta Fortis with help to decide about what antiarrhythmic therapy will be the best for her.  In the meantime I will do echocardiogram to confirm left ventricle ejection fraction.  Past Medical History:  Diagnosis Date   Dyspnea on exertion 06/06/2018   GERD (gastroesophageal reflux disease)    History of anemia    Palpitations 06/06/2018   Paroxysmal atrial fibrillation 06/06/2018   Persistent atrial fibrillation 07/24/2019   Snoring 11/07/2019   Vitamin D deficiency     Past Surgical History:  Procedure Laterality Date   APPENDECTOMY      Current Medications: Current Meds  Medication Sig   apixaban (ELIQUIS) 5 MG TABS tablet TAKE 1 TABLET BY MOUTH TWICE  DAILY   diltiazem (CARDIZEM CD) 180 MG 24 hr capsule TAKE 1 CAPSULE BY MOUTH AT  BEDTIME   diltiazem (CARDIZEM) 30 MG tablet Take 1 tablet (30 mg total) by mouth as  needed (tachycardia). If heart rate is over 120 (Patient taking differently: Take 30 mg by mouth daily.)   metoprolol succinate (TOPROL-XL) 50 MG 24 hr tablet TAKE 1 TABLET BY MOUTH AT  BEDTIME   pantoprazole (PROTONIX) 40 MG tablet Take 1 tablet (40 mg total) by mouth daily.   propafenone (RYTHMOL SR) 225 MG 12 hr capsule Take 1 capsule (225 mg total) by mouth 2 (two) times daily.   [DISCONTINUED] estradiol (ESTRACE) 0.1 MG/GM vaginal cream Place 1 g vaginally 2 (two) times a week.     Allergies:   Codeine, Levaquin [levofloxacin], Lidocaine, and Penicillins   Social History   Socioeconomic History   Marital status: Married    Spouse name: Not on file   Number of children: 2   Years of education: Not on file   Highest education level: Not on file  Occupational History   Not on file  Tobacco Use   Smoking status: Never   Smokeless tobacco: Never  Vaping Use   Vaping Use: Never used  Substance and Sexual Activity   Alcohol use: Never   Drug use: Never   Sexual activity: Not on file  Other Topics Concern   Not on file  Social History Narrative   Not on file   Social Determinants of Health   Financial Resource Strain: Not on file  Food Insecurity: Not on file  Transportation Needs: Not on file  Physical Activity: Not on file  Stress: Not on file  Social Connections: Not on file     Family History: The patient's family history includes Atrial fibrillation in her brother, father, mother, and sister; Heart failure in her father; Lung cancer in her mother. There is no history of Colon cancer, Pancreatic cancer, Esophageal cancer, Liver disease, Rectal cancer, or Prostate cancer. ROS:   Please see the history of present illness.    All 14 point review of systems negative except as described per history of present illness  EKGs/Labs/Other Studies Reviewed:      Recent Labs: No results found for requested labs within last 365 days.  Recent Lipid Panel No results found  for: "CHOL", "TRIG", "HDL", "CHOLHDL", "VLDL", "LDLCALC", "LDLDIRECT"  Physical Exam:    VS:  BP (!) 158/72 (BP Location: Left Arm, Patient Position: Sitting)   Pulse 100   Ht 5\' 8"  (1.727 m)   Wt 233 lb (105.7 kg)   SpO2 97%   BMI 35.43 kg/m     Wt Readings from Last 3 Encounters:  09/15/22 233 lb (105.7 kg)  03/26/22 233 lb (105.7 kg)  03/18/22 233 lb (105.7 kg)     GEN:  Well nourished, well developed in no acute distress HEENT: Normal NECK: No JVD; No carotid bruits LYMPHATICS: No lymphadenopathy CARDIAC: RRR, no murmurs, no rubs, no gallops RESPIRATORY:  Clear to auscultation without rales, wheezing or rhonchi  ABDOMEN: Soft, non-tender, non-distended MUSCULOSKELETAL:  No edema; No deformity  SKIN: Warm and dry LOWER EXTREMITIES: no swelling NEUROLOGIC:  Alert and oriented x 3 PSYCHIATRIC:  Normal affect   ASSESSMENT:    1. Dyspnea on exertion   2. Paroxysmal atrial fibrillation   3. Typical atrial flutter   4. Palpitations    PLAN:    In order of problems listed above:  Dyspnea on exertion multifactorial, echocardiogram will be done to check left ventricle ejection fraction. Paroxysmal atrial fibrillation now atrial flutter.  She was scheduled to have atrial fibrillation ablation by bailout of it.  She is scared of having this done.  She is on Rythmol however look like she is still in abnormal rhythm.  She will be referred to our EP colleagues for advice about the choice of antiarrhythmic therapy hopefully to be able to keep her in normal rhythm.  She did have 2 cardioversions so far. Palpitations.  Related to atrial flutter.   Medication Adjustments/Labs and Tests Ordered: Current medicines are reviewed at length with the patient today.  Concerns regarding medicines are outlined above.  Orders Placed This Encounter  Procedures   Ambulatory referral to Cardiology   EKG 12-Lead   ECHOCARDIOGRAM COMPLETE   Medication changes: No orders of the defined types  were placed in this encounter.   Signed, [name], MD, [facility] 09/15/2022 3:55 PM    [facility]

## 2022-09-15 NOTE — Patient Instructions (Signed)
Medication Instructions:  Your physician recommends that you continue on your current medications as directed. Please refer to the Current Medication list given to you today.  *If you need a refill on your cardiac medications before your next appointment, please call your pharmacy*   Lab Work: None Ordered If you have labs (blood work) drawn today and your tests are completely normal, you will receive your results only by: MyChart Message (if you have MyChart) OR A paper copy in the mail If you have any lab test that is abnormal or we need to change your treatment, we will call you to review the results.   Testing/Procedures: Your physician has requested that you have an echocardiogram. Echocardiography is a painless test that uses sound waves to create images of your heart. It provides your doctor with information about the size and shape of your heart and how well your heart's chambers and valves are working. This procedure takes approximately one hour. There are no restrictions for this procedure. Please do NOT wear cologne, perfume, aftershave, or lotions (deodorant is allowed). Please arrive 15 minutes prior to your appointment time.    Follow-Up: At [facility], you and your health needs are our priority.  As part of our continuing mission to provide you with exceptional heart care, we have created designated Provider Care Teams.  These Care Teams include your primary Cardiologist (physician) and Advanced Practice Providers (APPs -  Physician Assistants and Nurse Practitioners) who all work together to provide you with the care you need, when you need it.  We recommend signing up for the patient portal called "MyChart".  Sign up information is provided on this After Visit Summary.  MyChart is used to connect with patients for Virtual Visits (Telemedicine).  Patients are able to view lab/test results, encounter notes, upcoming appointments, etc.  Non-urgent messages can be sent to your  provider as well.   To learn more about what you can do with MyChart, go to [email or web address].    Your next appointment:   5 month(s)  The format for your next appointment:   In Person  Provider:   Gypsy Balsam, MD    Other Instructions Referral made to Dr. Elberta Fortis

## 2022-11-11 ENCOUNTER — Other Ambulatory Visit: Payer: Self-pay | Admitting: Cardiology

## 2022-11-11 DIAGNOSIS — I480 Paroxysmal atrial fibrillation: Secondary | ICD-10-CM

## 2022-11-12 NOTE — Telephone Encounter (Signed)
Prescription refill request for Eliquis received. Indication: Afib  Last office visit: 09/15/22 Bing Matter)  Scr: 0.70 (10/09/22 via PCP)  Age: 62 Weight: 105.7kg  Appropriate dose. Refill sent

## 2022-12-16 DIAGNOSIS — I361 Nonrheumatic tricuspid (valve) insufficiency: Secondary | ICD-10-CM | POA: Diagnosis not present

## 2022-12-16 DIAGNOSIS — I340 Nonrheumatic mitral (valve) insufficiency: Secondary | ICD-10-CM | POA: Diagnosis not present

## 2023-01-06 ENCOUNTER — Encounter: Payer: Self-pay | Admitting: Cardiology

## 2023-01-11 ENCOUNTER — Ambulatory Visit: Payer: Commercial Managed Care - PPO | Attending: Cardiology | Admitting: Cardiology

## 2023-01-11 ENCOUNTER — Encounter: Payer: Self-pay | Admitting: Cardiology

## 2023-01-11 VITALS — BP 116/74 | HR 67 | Ht 68.0 in | Wt 239.0 lb

## 2023-01-11 DIAGNOSIS — I480 Paroxysmal atrial fibrillation: Secondary | ICD-10-CM | POA: Diagnosis not present

## 2023-01-11 DIAGNOSIS — I483 Typical atrial flutter: Secondary | ICD-10-CM

## 2023-01-11 NOTE — Patient Instructions (Signed)
Medication Instructions:  Your physician recommends that you continue on your current medications as directed. Please refer to the Current Medication list given to you today.  *If you need a refill on your cardiac medications before your next appointment, please call your pharmacy*   Lab Work: Pre procedure labs -- see procedure instruction letter:  BMP & CBC  If you have labs (blood work) drawn today and your tests are completely normal, you will receive your results only [id number] Message (if you have MyChart). Otherwise no news is good news. If you have any lab test that is abnormal and we need to change your treatment, we will call you to review the results.   Testing/Procedures: Your physician has requested that you have cardiac CT within 7 days PRIOR to your ablation. Cardiac computed tomography (CT) is a painless test that uses an x-ray machine to take clear, detailed pictures of your heart.  Please follow instruction below located under "other instructions". You will get a call from our office to schedule the date for this test.  Your physician has recommended that you have an ablation. Catheter ablation is a medical procedure used to treat some cardiac arrhythmias (irregular heartbeats). During catheter ablation, a long, thin, flexible tube is put into a blood vessel in your groin (upper thigh), or neck. This tube is called an ablation catheter. It is then guided to your heart through the blood vessel. Radio frequency waves destroy small areas of heart tissue where abnormal heartbeats may cause an arrhythmia to start.   You will be scheduled for Janurary (we do not have dates yet, but someone will call once we do).  The EP scheduler, April, will be in touch with procedure instructions.   Follow-Up: At [facility], you and your health needs are our priority.  As part of our continuing mission to provide you with exceptional heart care, we have created designated Provider Care Teams.   These Care Teams include your primary Cardiologist (physician) and Advanced Practice Providers (APPs -  Physician Assistants and Nurse Practitioners) who all work together to provide you with the care you need, when you need it.  Your physician recommends that you schedule a follow-up appointment in: December with Dr. Elberta Fortis (for H&P for January ablation)  Your next appointment:   1 month(s) after your ablation  The format for your next appointment:   In Person  Provider:   AFib clinic   Thank you for choosing [facility]!!   Dory Horn, RN [phone]    Other Instructions   Cardiac Ablation Cardiac ablation is a procedure to destroy (ablate) some heart tissue that is sending bad signals. These bad signals cause problems in heart rhythm. The heart has many areas that make these signals. If there are problems in these areas, they can make the heart beat in a way that is not normal. Destroying some tissues can help make the heart rhythm normal. Tell your doctor about: Any allergies you have. All medicines you are taking. These include vitamins, herbs, eye drops, creams, and over-the-counter medicines. Any problems you or family members have had with medicines that make you fall asleep (anesthetics). Any blood disorders you have. Any surgeries you have had. Any medical conditions you have, such as kidney failure. Whether you are pregnant or may be pregnant. What are the risks? This is a safe procedure. But problems may occur, including: Infection. Bruising and bleeding. Bleeding into the chest. Stroke or blood clots. Damage to nearby areas of  your body. Allergies to medicines or dyes. The need for a pacemaker if the normal system is damaged. Failure of the procedure to treat the problem. What happens before the procedure? Medicines Ask your doctor about: Changing or stopping your normal medicines. This is important. Taking aspirin and ibuprofen. Do not take  these medicines unless your doctor tells you to take them. Taking other medicines, vitamins, herbs, and supplements. General instructions Follow instructions from your doctor about what you cannot eat or drink. Plan to have someone take you home from the hospital or clinic. If you will be going home right after the procedure, plan to have someone with you for 24 hours. Ask your doctor what steps will be taken to prevent infection. What happens during the procedure?  An IV tube will be put into one of your veins. You will be given a medicine to help you relax. The skin on your neck or groin will be numbed. A cut (incision) will be made in your neck or groin. A needle will be put through your cut and into a large vein. A tube (catheter) will be put into the needle. The tube will be moved to your heart. Dye may be put through the tube. This helps your doctor see your heart. Small devices (electrodes) on the tube will send out signals. A type of energy will be used to destroy some heart tissue. The tube will be taken out. Pressure will be held on your cut. This helps stop bleeding. A bandage will be put over your cut. The exact procedure may vary among doctors and hospitals. What happens after the procedure? You will be watched until you leave the hospital or clinic. This includes checking your heart rate, breathing rate, oxygen, and blood pressure. Your cut will be watched for bleeding. You will need to lie still for a few hours. Do not drive for 24 hours or as long as your doctor tells you. Summary Cardiac ablation is a procedure to destroy some heart tissue. This is done to treat heart rhythm problems. Tell your doctor about any medical conditions you may have. Tell him or her about all medicines you are taking to treat them. This is a safe procedure. But problems may occur. These include infection, bruising, bleeding, and damage to nearby areas of your body. Follow what your doctor tells  you about food and drink. You may also be told to change or stop some of your medicines. After the procedure, do not drive for 24 hours or as long as your doctor tells you. This information is not intended to replace advice given to you by your health care provider. Make sure you discuss any questions you have with your health care provider. Document Revised: 08/15/2021 Document Reviewed: 04/27/2019 Elsevier Patient Education  2023 Elsevier Inc.   Cardiac Ablation, Care After  This sheet gives you information about how to care for yourself after your procedure. Your health care provider may also give you more specific instructions. If you have problems or questions, contact your health care provider. What can I expect after the procedure? After the procedure, it is common to have: Bruising around your puncture site. Tenderness around your puncture site. Skipped heartbeats. If you had an atrial fibrillation ablation, you may have atrial fibrillation during the first several months after your procedure.  Tiredness (fatigue).  Follow these instructions at home: Puncture site care  Follow instructions from your health care provider about how to take care of your puncture site. Make  sure you: If present, leave stitches (sutures), skin glue, or adhesive strips in place. These skin closures may need to stay in place for up to 2 weeks. If adhesive strip edges start to loosen and curl up, you may trim the loose edges. Do not remove adhesive strips completely unless your health care provider tells you to do that. If a large square bandage is present, this may be removed 24 hours after surgery.  Check your puncture site every day for signs of infection. Check for: Redness, swelling, or pain. Fluid or blood. If your puncture site starts to bleed, lie down on your back, apply firm pressure to the area, and contact your health care provider. Warmth. Pus or a bad smell. A pea or small marble sized lump  at the site is normal and can take up to three months to resolve.  Driving Do not drive for at least 4 days after your procedure or however long your health care provider recommends. (Do not resume driving if you have previously been instructed not to drive for other health reasons.) Do not drive or use heavy machinery while taking prescription pain medicine. Activity Avoid activities that take a lot of effort for at least 7 days after your procedure. Do not lift anything that is heavier than 5 lb (4.5 kg) for one week.  No sexual activity for 1 week.  Return to your normal activities as told by your health care provider. Ask your health care provider what activities are safe for you. General instructions Take over-the-counter and prescription medicines only as told by your health care provider. Do not use any products that contain nicotine or tobacco, such as cigarettes and e-cigarettes. If you need help quitting, ask your health care provider. You may shower after 24 hours, but Do not take baths, swim, or use a hot tub for 1 week.  Do not drink alcohol for 24 hours after your procedure. Keep all follow-up visits as told by your health care provider. This is important. Contact a health care provider if: You have redness, mild swelling, or pain around your puncture site. You have fluid or blood coming from your puncture site that stops after applying firm pressure to the area. Your puncture site feels warm to the touch. You have pus or a bad smell coming from your puncture site. You have a fever. You have chest pain or discomfort that spreads to your neck, jaw, or arm. You have chest pain that is worse with lying on your back or taking a deep breath. You are sweating a lot. You feel nauseous. You have a fast or irregular heartbeat. You have shortness of breath. You are dizzy or light-headed and feel the need to lie down. You have pain or numbness in the arm or leg closest to your puncture  site. Get help right away if: Your puncture site suddenly swells. Your puncture site is bleeding and the bleeding does not stop after applying firm pressure to the area. These symptoms may represent a serious problem that is an emergency. Do not wait to see if the symptoms will go away. Get medical help right away. Call your local emergency services (911 in the [place] Do not drive yourself to the hospital. Summary After the procedure, it is normal to have bruising and tenderness at the puncture site in your groin, neck, or forearm. Check your puncture site every day for signs of infection. Get help right away if your puncture site is bleeding and the bleeding  does not stop after applying firm pressure to the area. This is a medical emergency. This information is not intended to replace advice given to you by your health care provider. Make sure you discuss any questions you have with your health care provider.

## 2023-01-11 NOTE — Progress Notes (Signed)
Electrophysiology Office Note:   Date:  01/11/2023  ID:  Michelle Perkins, DOB 06-06-1961, MRN [id number]  Primary Cardiologist: Gypsy Balsam, MD Electrophysiologist: Regan Lemming, MD      History of Present Illness:   Michelle Perkins is a 62 y.o. female with h/o AF seen today for routine electrophysiology followup.  Since last being seen in our clinic the patient reports doing well.  She continues to exercise.  She only feels mildly fatigued when she is hiking at altitude.  Despite this, she would like get back into normal rhythm.  She has had multiple cardioversions in the past and has remained in atrial flutter despite propafenone.  she denies chest pain, palpitations, dyspnea, PND, orthopnea, nausea, vomiting, dizziness, syncope, edema, weight gain, or early satiety.   Review of systems complete and found to be negative unless listed in HPI.   EP Information / Studies Reviewed:    EKG is ordered today. Personal review as below.  EKG Interpretation Date/Time:  Monday January 11 2023 08:45:46 EDT Ventricular Rate:  67 PR Interval:    QRS Duration:  80 QT Interval:  410 QTC Calculation: 433 R Axis:   24  Text Interpretation: Atrial flutter with variable A-V block Low voltage QRS Marked ST abnormality, possible lateral subendocardial injury Abnormal ECG No previous ECGs available Confirmed by Coriann Brouhard ([id number]) on 01/11/2023 9:00:06 AM     Risk Assessment/Calculations:    CHA2DS2-VASc Score = 1   This indicates a 0.6% annual risk of stroke. The patient's score is based upon: CHF History: 0 HTN History: 0 Diabetes History: 0 Stroke History: 0 Vascular Disease History: 0 Age Score: 0 Gender Score: 1              Physical Exam:   VS:  BP 116/74   Pulse 67   Ht 5\' 8"  (1.727 m)   Wt 239 lb (108.4 kg)   SpO2 96%   BMI 36.34 kg/m    Wt Readings from Last 3 Encounters:  01/11/23 239 lb (108.4 kg)  09/15/22 233 lb (105.7 kg)  03/26/22 233 lb (105.7 kg)      GEN: Well nourished, well developed in no acute distress NECK: No JVD; No carotid bruits CARDIAC: Irregularly irregular rate and rhythm, no murmurs, rubs, gallops RESPIRATORY:  Clear to auscultation without rales, wheezing or rhonchi  ABDOMEN: Soft, non-tender, non-distended EXTREMITIES:  No edema; No deformity   ASSESSMENT AND PLAN:    1.  Persistent atrial fibrillation/flutter: Currently on propafenone, diltiazem, Eliquis.  She remains in atrial flutter.  Despite her propafenone, she would like to get back into normal rhythm.  She would like to avoid long-term medications if possible.  Due to that, we Genelda Roark plan for ablation.  Risk and benefits have been discussed.  She understands the risks and is agreed to the procedure.  I discussed this further with her primary cardiologist.  Risk, benefits, and alternatives to EP study and radiofrequency ablation for afib were also discussed in detail today. These risks include but are not limited to stroke, bleeding, vascular damage, tamponade, perforation, damage to the esophagus, lungs, and other structures, pulmonary vein stenosis, worsening renal function, and death. The patient understands these risk and wishes to proceed.  We Biruk Troia therefore proceed with catheter ablation at the next available time.  Carto, ICE, anesthesia are requested for the procedure.  Charlann Wayne also obtain CT PV protocol prior to the procedure to exclude LAA thrombus and further evaluate atrial anatomy.  Follow up  with Dr. Elberta Fortis as usual post procedure  Signed, Colbie Sliker Jorja Loa, MD

## 2023-01-12 ENCOUNTER — Other Ambulatory Visit: Payer: Self-pay | Admitting: Cardiology

## 2023-02-04 ENCOUNTER — Other Ambulatory Visit: Payer: Self-pay | Admitting: Cardiology

## 2023-02-05 NOTE — Telephone Encounter (Signed)
Rx refill sent to pharmacy. 

## 2023-02-22 ENCOUNTER — Encounter: Payer: Self-pay | Admitting: Cardiology

## 2023-02-22 ENCOUNTER — Ambulatory Visit: Payer: Commercial Managed Care - PPO | Attending: Cardiology | Admitting: Cardiology

## 2023-02-22 VITALS — BP 134/84 | HR 93 | Ht 68.0 in | Wt 240.6 lb

## 2023-02-22 DIAGNOSIS — I10 Essential (primary) hypertension: Secondary | ICD-10-CM | POA: Diagnosis not present

## 2023-02-22 DIAGNOSIS — R0609 Other forms of dyspnea: Secondary | ICD-10-CM

## 2023-02-22 DIAGNOSIS — I4819 Other persistent atrial fibrillation: Secondary | ICD-10-CM | POA: Diagnosis not present

## 2023-02-22 DIAGNOSIS — R002 Palpitations: Secondary | ICD-10-CM

## 2023-02-22 DIAGNOSIS — R0600 Dyspnea, unspecified: Secondary | ICD-10-CM | POA: Diagnosis not present

## 2023-02-22 NOTE — Progress Notes (Signed)
Cardiology Office Note:    Date:  02/22/2023   ID:  Michelle Perkins, DOB 1961-04-02, MRN [id number]  PCP:  Philemon Kingdom, MD  Cardiologist:  Gypsy Balsam, MD    Referring MD: Philemon Kingdom, MD   Chief Complaint  Patient presents with   Follow-up    History of Present Illness:    Michelle Perkins is a 62 y.o. female with past medical history significant for paroxysmal atrial fibrillation, paroxysmal atrial flutter and recently she has been persistently in those arrhythmias.  She is being put on Rythmol with intention to control her rate however she ended up going back to abnormal rhythm atrial fibrillation.  She was scheduled to have atrial fibrillation ablation done however it did not happen she was reluctant for now finally she agreed it looks like she will be scheduled to have it done in January.  Otherwise she seems to be doing well.  She can walk climb stairs with some shortness of breath.  Denies have any chest pain tightness squeezing pressure burning chest  Past Medical History:  Diagnosis Date   Dyspnea on exertion 06/06/2018   GERD (gastroesophageal reflux disease)    History of anemia    Palpitations 06/06/2018   Paroxysmal atrial fibrillation (HCC) 06/06/2018   Persistent atrial fibrillation (HCC) 07/24/2019   Snoring 11/07/2019   Vitamin D deficiency     Past Surgical History:  Procedure Laterality Date   APPENDECTOMY      Current Medications: Current Meds  Medication Sig   diltiazem (CARDIZEM CD) 180 MG 24 hr capsule TAKE 1 CAPSULE BY MOUTH AT  BEDTIME   diltiazem (CARDIZEM) 30 MG tablet Take 1 tablet (30 mg total) by mouth as needed (tachycardia). If heart rate is over 120 (Patient taking differently: Take 30 mg by mouth daily.)   ELIQUIS 5 MG TABS tablet TAKE 1 TABLET BY MOUTH TWICE  DAILY   metoprolol succinate (TOPROL-XL) 50 MG 24 hr tablet TAKE 1 TABLET BY MOUTH AT  BEDTIME   pantoprazole (PROTONIX) 40 MG tablet Take 1 tablet (40 mg total) by  mouth daily.   propafenone (RYTHMOL SR) 225 MG 12 hr capsule TAKE 1 CAPSULE BY MOUTH TWICE  DAILY     Allergies:   Codeine, Levaquin [levofloxacin], Lidocaine, and Penicillins   Social History   Socioeconomic History   Marital status: Married    Spouse name: Not on file   Number of children: 2   Years of education: Not on file   Highest education level: Not on file  Occupational History   Not on file  Tobacco Use   Smoking status: Never   Smokeless tobacco: Never  Vaping Use   Vaping status: Never Used  Substance and Sexual Activity   Alcohol use: Never   Drug use: Never   Sexual activity: Not on file  Other Topics Concern   Not on file  Social History Narrative   Not on file   Social Determinants of Health   Financial Resource Strain: Not on file  Food Insecurity: Not on file  Transportation Needs: Not on file  Physical Activity: Not on file  Stress: Not on file  Social Connections: Not on file     Family History: The patient's family history includes Atrial fibrillation in her brother, father, mother, and sister; Heart failure in her father; Lung cancer in her mother. There is no history of Colon cancer, Pancreatic cancer, Esophageal cancer, Liver disease, Rectal cancer, or Prostate cancer. ROS:   Please see  the history of present illness.    All 14 point review of systems negative except as described per history of present illness  EKGs/Labs/Other Studies Reviewed:         Recent Labs: No results found for requested labs within last 365 days.  Recent Lipid Panel No results found for: "CHOL", "TRIG", "HDL", "CHOLHDL", "VLDL", "LDLCALC", "LDLDIRECT"  Physical Exam:    VS:  BP 134/84 (BP Location: Left Arm, Patient Position: Sitting)   Pulse 93   Ht 5\' 8"  (1.727 m)   Wt 240 lb 9.6 oz (109.1 kg)   SpO2 96%   BMI 36.58 kg/m     Wt Readings from Last 3 Encounters:  02/22/23 240 lb 9.6 oz (109.1 kg)  01/11/23 239 lb (108.4 kg)  09/15/22 233 lb (105.7  kg)     GEN:  Well nourished, well developed in no acute distress HEENT: Normal NECK: No JVD; No carotid bruits LYMPHATICS: No lymphadenopathy CARDIAC: Irregularly regular, no murmurs, no rubs, no gallops RESPIRATORY:  Clear to auscultation without rales, wheezing or rhonchi  ABDOMEN: Soft, non-tender, non-distended MUSCULOSKELETAL:  No edema; No deformity  SKIN: Warm and dry LOWER EXTREMITIES: no swelling NEUROLOGIC:  Alert and oriented x 3 PSYCHIATRIC:  Normal affect   ASSESSMENT:    1. Persistent atrial fibrillation (HCC)   2. Essential hypertension   3. Dyspnea on exertion   4. Palpitations    PLAN:    In order of problems listed above:  Persistent atrial fibrillation: Will discontinue her Rythmol look like he does not do it is designed to do weaning her arrhythmias to recur, obviously we will continue with anticoagulation for now.  She is getting ready to have ablation which will be done probably in January. Essential hypertension blood pressure well-controlled continue present management. Dyspnea exertion with family only with extreme exertion.  Will continue monitoring we did talk about atrial fibrillation ablation   Medication Adjustments/Labs and Tests Ordered: Current medicines are reviewed at length with the patient today.  Concerns regarding medicines are outlined above.  No orders of the defined types were placed in this encounter.  Medication changes: No orders of the defined types were placed in this encounter.   Signed, Michelle Lea, MD, [facility] 02/22/2023 3:15 PM    [facility]

## 2023-02-22 NOTE — Patient Instructions (Addendum)
Medication Instructions:  STOP: propafenone (RYTHMOL SR)     Lab Work: None Ordered If you have labs (blood work) drawn today and your tests are completely normal, you will receive your results only by: MyChart Message (if you have MyChart) OR A paper copy in the mail If you have any lab test that is abnormal or we need to change your treatment, we will call you to review the results.   Testing/Procedures: None Ordered   Follow-Up: At [facility], you and your health needs are our priority.  As part of our continuing mission to provide you with exceptional heart care, we have created designated Provider Care Teams.  These Care Teams include your primary Cardiologist (physician) and Advanced Practice Providers (APPs -  Physician Assistants and Nurse Practitioners) who all work together to provide you with the care you need, when you need it.  We recommend signing up for the patient portal called "MyChart".  Sign up information is provided on this After Visit Summary.  MyChart is used to connect with patients for Virtual Visits (Telemedicine).  Patients are able to view lab/test results, encounter notes, upcoming appointments, etc.  Non-urgent messages can be sent to your provider as well.   To learn more about what you can do with MyChart, go to [email or web address].    Your next appointment:   6 month(s)  The format for your next appointment:   In Person  Provider:   Gypsy Balsam, MD    Other Instructions NA

## 2023-02-23 ENCOUNTER — Telehealth: Payer: Self-pay

## 2023-02-23 NOTE — Telephone Encounter (Signed)
LM for pt to call back to schedule Ablation with Dr. Elberta Fortis.

## 2023-03-15 NOTE — Telephone Encounter (Signed)
Left message to call back  

## 2023-05-17 ENCOUNTER — Encounter: Payer: Self-pay | Admitting: Cardiology

## 2023-05-17 ENCOUNTER — Ambulatory Visit: Payer: Commercial Managed Care - PPO | Attending: Cardiology | Admitting: Cardiology

## 2023-05-17 VITALS — BP 138/82 | HR 129 | Ht 68.0 in | Wt 239.0 lb

## 2023-05-17 DIAGNOSIS — I4819 Other persistent atrial fibrillation: Secondary | ICD-10-CM

## 2023-05-17 DIAGNOSIS — I10 Essential (primary) hypertension: Secondary | ICD-10-CM

## 2023-05-17 NOTE — Progress Notes (Signed)
Electrophysiology Office Note:   Date:  05/17/2023  ID:  Michelle Perkins, DOB [birth year], MRN [id number]  Primary Cardiologist: Gypsy Balsam, MD Electrophysiologist: Regan Lemming, MD      History of Present Illness:   Michelle Perkins is a 62 y.o. female with h/o atrial fibrillation seen today for routine electrophysiology followup.   Since last being seen in our clinic the patient reports doing overall well.  She does continue to have episodes of atrial flutter, but this has been somewhat well-controlled with as needed diltiazem and daily diltiazem.  Despite this, she feels tired and fatigued.  She would like to get back into normal rhythm.  She would prefer to avoid long-term antiarrhythmics..  she denies chest pain, palpitations, dyspnea, PND, orthopnea, nausea, vomiting, dizziness, syncope, edema, weight gain, or early satiety.   Review of systems complete and found to be negative unless listed in HPI.   EP Information / Studies Reviewed:    EKG is ordered today. Personal review as below.  EKG Interpretation Date/Time:  Monday May 17 2023 09:00:24 EST Ventricular Rate:  129 PR Interval:  270 QRS Duration:  64 QT Interval:  316 QTC Calculation: 462 R Axis:   46  Text Interpretation: Atrial flutter with 2 to 1 block Septal infarct , age undetermined Marked ST abnormality, possible inferolateral subendocardial injury Abnormal ECG When compared with ECG of 11-Jan-2023 08:45, Rate faster Confirmed by Cleopha Indelicato ([id number]) on 05/17/2023 9:05:47 AM     Risk Assessment/Calculations:    CHA2DS2-VASc Score = 1   This indicates a 0.6% annual risk of stroke. The patient's score is based upon: CHF History: 0 HTN History: 0 Diabetes History: 0 Stroke History: 0 Vascular Disease History: 0 Age Score: 0 Gender Score: 1              Physical Exam:   VS:  BP 138/82   Pulse (!) 129   Ht 5\' 8"  (1.727 m)   Wt 239 lb (108.4 kg)   SpO2 95%   BMI 36.34 kg/m    Wt  Readings from Last 3 Encounters:  05/17/23 239 lb (108.4 kg)  02/22/23 240 lb 9.6 oz (109.1 kg)  01/11/23 239 lb (108.4 kg)     GEN: Well nourished, well developed in no acute distress NECK: No JVD; No carotid bruits CARDIAC: Irregularly irregular rate and rhythm, no murmurs, rubs, gallops RESPIRATORY:  Clear to auscultation without rales, wheezing or rhonchi  ABDOMEN: Soft, non-tender, non-distended EXTREMITIES:  No edema; No deformity   ASSESSMENT AND PLAN:    1.  Persistent atrial fibrillation/flutter: Currently on diltiazem, Eliquis.  She is continue to have episodes of atrial fibrillation and flutter.  She is in atrial flutter today with rapid rates.  She takes diltiazem 30 mg as needed due to rapid rates.  She would prefer a rhythm control strategy.  Analea Muller plan for ablation.  Risk and benefits have been discussed.  She understands the risks and is agreed to the procedure.  Risk, benefits, and alternatives to EP study and radiofrequency/pulse field ablation for afib were also discussed in detail today. These risks include but are not limited to stroke, bleeding, vascular damage, tamponade, perforation, damage to the esophagus, lungs, and other structures, pulmonary vein stenosis, worsening renal function, and death. The patient understands these risk and wishes to proceed.  We Odaliz Mcqueary therefore proceed with catheter ablation at the next available time.  Carto, ICE, anesthesia are requested for the procedure.  Trinton Prewitt also obtain CT PV  protocol prior to the procedure to exclude LAA thrombus and further evaluate atrial anatomy.  2.  Hypertension: Currently well-controlled  Follow up with Dr. Elberta Fortis as usual post procedure  Signed, Mirabelle Cyphers Jorja Loa, MD

## 2023-05-17 NOTE — Patient Instructions (Signed)
Medication Instructions:  Your physician recommends that you continue on your current medications as directed. Please refer to the Current Medication list given to you today.  *If you need a refill on your cardiac medications before your next appointment, please call your pharmacy*   Lab Work: Pre procedure labs -- we will call you to schedule:  BMP & CBC  If you have a lab test that is abnormal and we need to change your treatment, we will call you to review the results -- otherwise no news is good news.    Testing/Procedures: Your physician has requested that you have cardiac CT 1 month PRIOR to your ablation. Cardiac computed tomography (CT) is a painless test that uses an x-ray machine to take clear, detailed pictures of your heart.  We will call you to schedule.  Your physician has recommended that you have an ablation. Catheter ablation is a medical procedure used to treat some cardiac arrhythmias (irregular heartbeats). During catheter ablation, a long, thin, flexible tube is put into a blood vessel in your groin (upper thigh), or neck. This tube is called an ablation catheter. It is then guided to your heart through the blood vessel. Radio frequency waves destroy small areas of heart tissue where abnormal heartbeats may cause an arrhythmia to start.   We will call you to schedule this procedure, it may be several weeks or more.   Follow-Up: At [facility], you and your health needs are our priority.  As part of our continuing mission to provide you with exceptional heart care, we have created designated Provider Care Teams.  These Care Teams include your primary Cardiologist (physician) and Advanced Practice Providers (APPs -  Physician Assistants and Nurse Practitioners) who all work together to provide you with the care you need, when you need it.  Your next appointment:   1 month(s) after your ablation  The format for your next appointment:   In Person  Provider:   AFib  clinic   Thank you for choosing [facility]!!   Dory Horn, RN [phone]    Other Instructions   Cardiac Ablation Cardiac ablation is a procedure to destroy (ablate) some heart tissue that is sending bad signals. These bad signals cause problems in heart rhythm. The heart has many areas that make these signals. If there are problems in these areas, they can make the heart beat in a way that is not normal. Destroying some tissues can help make the heart rhythm normal. Tell your doctor about: Any allergies you have. All medicines you are taking. These include vitamins, herbs, eye drops, creams, and over-the-counter medicines. Any problems you or family members have had with medicines that make you fall asleep (anesthetics). Any blood disorders you have. Any surgeries you have had. Any medical conditions you have, such as kidney failure. Whether you are pregnant or may be pregnant. What are the risks? This is a safe procedure. But problems may occur, including: Infection. Bruising and bleeding. Bleeding into the chest. Stroke or blood clots. Damage to nearby areas of your body. Allergies to medicines or dyes. The need for a pacemaker if the normal system is damaged. Failure of the procedure to treat the problem. What happens before the procedure? Medicines Ask your doctor about: Changing or stopping your normal medicines. This is important. Taking aspirin and ibuprofen. Do not take these medicines unless your doctor tells you to take them. Taking other medicines, vitamins, herbs, and supplements. General instructions Follow instructions from your doctor  about what you cannot eat or drink. Plan to have someone take you home from the hospital or clinic. If you will be going home right after the procedure, plan to have someone with you for 24 hours. Ask your doctor what steps will be taken to prevent infection. What happens during the procedure?  An IV tube will be  put into one of your veins. You will be given a medicine to help you relax. The skin on your neck or groin will be numbed. A cut (incision) will be made in your neck or groin. A needle will be put through your cut and into a large vein. A tube (catheter) will be put into the needle. The tube will be moved to your heart. Dye may be put through the tube. This helps your doctor see your heart. Small devices (electrodes) on the tube will send out signals. A type of energy will be used to destroy some heart tissue. The tube will be taken out. Pressure will be held on your cut. This helps stop bleeding. A bandage will be put over your cut. The exact procedure may vary among doctors and hospitals. What happens after the procedure? You will be watched until you leave the hospital or clinic. This includes checking your heart rate, breathing rate, oxygen, and blood pressure. Your cut will be watched for bleeding. You will need to lie still for a few hours. Do not drive for 24 hours or as long as your doctor tells you. Summary Cardiac ablation is a procedure to destroy some heart tissue. This is done to treat heart rhythm problems. Tell your doctor about any medical conditions you may have. Tell him or her about all medicines you are taking to treat them. This is a safe procedure. But problems may occur. These include infection, bruising, bleeding, and damage to nearby areas of your body. Follow what your doctor tells you about food and drink. You may also be told to change or stop some of your medicines. After the procedure, do not drive for 24 hours or as long as your doctor tells you. This information is not intended to replace advice given to you by your health care provider. Make sure you discuss any questions you have with your health care provider. Document Revised: 08/15/2021 Document Reviewed: 04/27/2019 Elsevier Patient Education  2023 Elsevier Inc.   Cardiac Ablation, Care After  This  sheet gives you information about how to care for yourself after your procedure. Your health care provider may also give you more specific instructions. If you have problems or questions, contact your health care provider. What can I expect after the procedure? After the procedure, it is common to have: Bruising around your puncture site. Tenderness around your puncture site. Skipped heartbeats. If you had an atrial fibrillation ablation, you may have atrial fibrillation during the first several months after your procedure.  Tiredness (fatigue).  Follow these instructions at home: Puncture site care  Follow instructions from your health care provider about how to take care of your puncture site. Make sure you: If present, leave stitches (sutures), skin glue, or adhesive strips in place. These skin closures may need to stay in place for up to 2 weeks. If adhesive strip edges start to loosen and curl up, you may trim the loose edges. Do not remove adhesive strips completely unless your health care provider tells you to do that. If a large square bandage is present, this may be removed 24 hours after surgery.  Check your puncture site every day for signs of infection. Check for: Redness, swelling, or pain. Fluid or blood. If your puncture site starts to bleed, lie down on your back, apply firm pressure to the area, and contact your health care provider. Warmth. Pus or a bad smell. A pea or small marble sized lump at the site is normal and can take up to three months to resolve.  Driving Do not drive for at least 4 days after your procedure or however long your health care provider recommends. (Do not resume driving if you have previously been instructed not to drive for other health reasons.) Do not drive or use heavy machinery while taking prescription pain medicine. Activity Avoid activities that take a lot of effort for at least 7 days after your procedure. Do not lift anything that is  heavier than 5 lb (4.5 kg) for one week.  No sexual activity for 1 week.  Return to your normal activities as told by your health care provider. Ask your health care provider what activities are safe for you. General instructions Take over-the-counter and prescription medicines only as told by your health care provider. Do not use any products that contain nicotine or tobacco, such as cigarettes and e-cigarettes. If you need help quitting, ask your health care provider. You may shower after 24 hours, but Do not take baths, swim, or use a hot tub for 1 week.  Do not drink alcohol for 24 hours after your procedure. Keep all follow-up visits as told by your health care provider. This is important. Contact a health care provider if: You have redness, mild swelling, or pain around your puncture site. You have fluid or blood coming from your puncture site that stops after applying firm pressure to the area. Your puncture site feels warm to the touch. You have pus or a bad smell coming from your puncture site. You have a fever. You have chest pain or discomfort that spreads to your neck, jaw, or arm. You have chest pain that is worse with lying on your back or taking a deep breath. You are sweating a lot. You feel nauseous. You have a fast or irregular heartbeat. You have shortness of breath. You are dizzy or light-headed and feel the need to lie down. You have pain or numbness in the arm or leg closest to your puncture site. Get help right away if: Your puncture site suddenly swells. Your puncture site is bleeding and the bleeding does not stop after applying firm pressure to the area. These symptoms may represent a serious problem that is an emergency. Do not wait to see if the symptoms will go away. Get medical help right away. Call your local emergency services (911 in the [place] Do not drive yourself to the hospital. Summary After the procedure, it is normal to have bruising and tenderness  at the puncture site in your groin, neck, or forearm. Check your puncture site every day for signs of infection. Get help right away if your puncture site is bleeding and the bleeding does not stop after applying firm pressure to the area. This is a medical emergency. This information is not intended to replace advice given to you by your health care provider. Make sure you discuss any questions you have with your health care provider.

## 2023-06-14 ENCOUNTER — Telehealth: Payer: Self-pay | Admitting: Gastroenterology

## 2023-06-14 MED ORDER — PANTOPRAZOLE SODIUM 40 MG PO TBEC: 40.0000 mg | DELAYED_RELEASE_TABLET | Freq: Every day | ORAL | 3 refills | Status: DC

## 2023-06-14 NOTE — Telephone Encounter (Signed)
 Inbound call from patient requesting a refill for pantoprazole medication. Please advise, thank you.

## 2023-06-14 NOTE — Telephone Encounter (Signed)
 Done

## 2023-06-15 ENCOUNTER — Other Ambulatory Visit: Payer: Self-pay | Admitting: Cardiology

## 2023-06-23 ENCOUNTER — Other Ambulatory Visit: Payer: Self-pay | Admitting: Cardiology

## 2023-06-29 ENCOUNTER — Telehealth: Payer: Self-pay | Admitting: *Deleted

## 2023-06-29 DIAGNOSIS — I4819 Other persistent atrial fibrillation: Secondary | ICD-10-CM

## 2023-06-29 DIAGNOSIS — Z01812 Encounter for preprocedural laboratory examination: Secondary | ICD-10-CM

## 2023-06-29 NOTE — Telephone Encounter (Signed)
Left message to call back to schedule afib ablation.

## 2023-07-07 NOTE — Telephone Encounter (Signed)
Patient was calling back to schedule the ablation. Please advise

## 2023-07-07 NOTE — Telephone Encounter (Signed)
Pt scheduled for 4/30. Aware office would be in touch to go over instructions at a later date.  She understands they may call to schedule CT before we call with instructions. Patient verbalized understanding and agreeable to plan.

## 2023-07-09 ENCOUNTER — Other Ambulatory Visit: Payer: Self-pay | Admitting: Gastroenterology

## 2023-09-13 ENCOUNTER — Telehealth (HOSPITAL_COMMUNITY): Payer: Self-pay

## 2023-09-13 NOTE — Telephone Encounter (Signed)
 Attempted to reach patient to discuss upcoming procedure, no answer. Left VM for patient to return call.

## 2023-09-15 ENCOUNTER — Ambulatory Visit (HOSPITAL_COMMUNITY)
Admission: RE | Admit: 2023-09-15 | Discharge: 2023-09-15 | Disposition: A | Discharge status: Home / Self Care | Payer: Commercial Managed Care - PPO | Source: Ambulatory Visit | Attending: Cardiology | Admitting: Cardiology

## 2023-09-15 DIAGNOSIS — I4819 Other persistent atrial fibrillation: Secondary | ICD-10-CM

## 2023-09-15 MED ORDER — IOHEXOL 350 MG/ML SOLN
95.0000 mL | Freq: Once | INTRAVENOUS | Status: AC | PRN
  Administered 2023-09-15: 95 mL via INTRAVENOUS

## 2023-09-21 NOTE — Telephone Encounter (Signed)
 Spoke with patient to complete pre-procedure call.     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 Will fax requisitions to facility.   New medical conditions?  No Recent hospitalizations or surgeries? No Any changes in activities of daily living? No Started any new medications? No Any medications to hold? No Any missed doses of blood thinner?  No Advised patient to continue taking ANTICOAGULANT: Eliquis (Apixaban) without missing any doses.   Medication instructions: On the morning of your procedure DO NOT take any medication., including Eliquis or the procedure may be rescheduled.  Nothing to eat or drink after midnight prior to your procedure.  Confirmed patient is scheduled for Atrial Fibrillation Ablation and Atrial Flutter Ablation on Wednesday, April 30 with Dr. Agatha Horsfall. Instructed patient to arrive at the Main Entrance A at [facility]: [address] [place], [place] [address] and check in at Admitting at 9:00 AM/PM  Advised of plan to go home the same day and will only stay overnight if medically necessary. You MUST have a responsible adult to drive you home and MUST be with you the first 24 hours after you arrive home or your procedure could be cancelled.  Patient verbalized understanding to information provided and is agreeable to proceed with procedure.

## 2023-09-29 ENCOUNTER — Telehealth (HOSPITAL_COMMUNITY): Payer: Self-pay

## 2023-09-29 LAB — LAB REPORT - SCANNED: EGFR: 72

## 2023-09-29 NOTE — Telephone Encounter (Addendum)
 Call placed to patient to discuss upcoming procedure.   CT: completed.  Labs: completed today, 09/29/23 at [facility].   Any recent signs of acute illness or been started on antibiotics? No Any new medications started? No Any medications to hold? No Any missed doses of blood thinner? No Advised patient to continue taking ANTICOAGULANT: Eliquis  (Apixaban ) twice daily without missing any doses.  Medication instructions:  On the morning of your procedure DO NOT take any medication., including Eliquis  or the procedure may be rescheduled. Nothing to eat or drink after midnight prior to your procedure.  Confirmed patient is scheduled for Atrial Fibrillation Ablation/ Atrial Flutter Ablation on Wednesday, April 30 with Dr. Agatha Horsfall. Instructed patient to arrive at the Main Entrance A at [facility]: [address] [place], [place] [address] and check in at Admitting at 9:00 AM.  Advised of plan to go home the same day and will only stay overnight if medically necessary. You MUST have a responsible adult to drive you home and MUST be with you the first 24 hours after you arrive home or your procedure could be cancelled.  Patient verbalized understanding to all instructions provided and agreed to proceed with procedure.

## 2023-10-04 ENCOUNTER — Other Ambulatory Visit: Payer: Self-pay | Admitting: Cardiology

## 2023-10-04 ENCOUNTER — Telehealth: Payer: Self-pay | Admitting: Cardiology

## 2023-10-04 DIAGNOSIS — I480 Paroxysmal atrial fibrillation: Secondary | ICD-10-CM

## 2023-10-04 NOTE — Telephone Encounter (Signed)
 Spoke with patient and she states she will be faxing over FMLA paperwork for her ablation being performed on Wednesday.

## 2023-10-04 NOTE — Telephone Encounter (Signed)
 Patient called to say that she has fmla paperwork that she needs to be filled out. She will faxed forms over to our office. Please advise

## 2023-10-04 NOTE — Telephone Encounter (Signed)
 Pt made aware that she will need to pay a $29 fee to have fmla paperwork filled out.  Aware she will have to make the payment prior to it being sent to MD to complete.  Patient states she will stop by the [facility] office tomorrow to pay.  She appreciates my call to inform her of what was needed.

## 2023-10-05 ENCOUNTER — Telehealth: Payer: Self-pay | Admitting: Cardiology

## 2023-10-05 DIAGNOSIS — Z0279 Encounter for issue of other medical certificate: Secondary | ICD-10-CM

## 2023-10-05 NOTE — Telephone Encounter (Signed)
 Forms received from patient for [facility] for intermittent FMLA on 10/05/23. Cash received and placed in safe.Incomplete forms along with completed patient authorization scanned into chart. Dr [employer] team made aware via telephone encounter sent to Reece Cane, RN.  KBL 10/05/23

## 2023-10-05 NOTE — Telephone Encounter (Signed)
 Late entry: CBC and BMP received via fax from [facility] and will be scanned to media for review.

## 2023-10-05 NOTE — Pre-Procedure Instructions (Signed)
 Instructed patient on the following items: Arrival time 0800, new arrival time Nothing to eat or drink after midnight No meds AM of procedure Responsible person to drive you home and stay with you for 24 hrs  Have you missed any doses of anti-coagulant Eliquis - should be take twice a day, if you have missed any doses please let us  know.  Don't take dose morning of procedure.

## 2023-10-05 NOTE — Telephone Encounter (Signed)
 Prescription refill request for Eliquis  received. Indication:afib Last office visit:12/24 Scr:0.90  4/25 Age: 63 Weight:108.4  kg  Prescription refilled

## 2023-10-05 NOTE — Telephone Encounter (Signed)
 Forms and payment received from patient and scanned into chart for easy access for Dr. Lawana Pray team to fill out. Authorization/release also completed and scanned.  Completed forms to be faxed to [facility] Fax Number [phone]  Please call patient to inform of completion.  Thank you

## 2023-10-06 ENCOUNTER — Ambulatory Visit (HOSPITAL_BASED_OUTPATIENT_CLINIC_OR_DEPARTMENT_OTHER): Admitting: Registered Nurse

## 2023-10-06 ENCOUNTER — Encounter (HOSPITAL_COMMUNITY)
Admission: RE | Disposition: A | Discharge status: Home / Self Care | Payer: Self-pay | Source: Home / Self Care | Attending: Cardiology

## 2023-10-06 ENCOUNTER — Ambulatory Visit (HOSPITAL_COMMUNITY)
Admission: RE | Admit: 2023-10-06 | Discharge: 2023-10-06 | Disposition: A | Discharge status: Home / Self Care | Payer: Commercial Managed Care - PPO | Attending: Cardiology | Admitting: Cardiology

## 2023-10-06 ENCOUNTER — Ambulatory Visit (HOSPITAL_COMMUNITY): Admitting: Registered Nurse

## 2023-10-06 ENCOUNTER — Other Ambulatory Visit: Payer: Self-pay

## 2023-10-06 DIAGNOSIS — I4819 Other persistent atrial fibrillation: Secondary | ICD-10-CM | POA: Insufficient documentation

## 2023-10-06 DIAGNOSIS — K219 Gastro-esophageal reflux disease without esophagitis: Secondary | ICD-10-CM | POA: Insufficient documentation

## 2023-10-06 DIAGNOSIS — I10 Essential (primary) hypertension: Secondary | ICD-10-CM | POA: Diagnosis not present

## 2023-10-06 DIAGNOSIS — I4891 Unspecified atrial fibrillation: Secondary | ICD-10-CM | POA: Diagnosis not present

## 2023-10-06 DIAGNOSIS — I483 Typical atrial flutter: Secondary | ICD-10-CM | POA: Diagnosis not present

## 2023-10-06 DIAGNOSIS — I4892 Unspecified atrial flutter: Secondary | ICD-10-CM | POA: Diagnosis not present

## 2023-10-06 DIAGNOSIS — Z79899 Other long term (current) drug therapy: Secondary | ICD-10-CM | POA: Insufficient documentation

## 2023-10-06 LAB — POCT ACTIVATED CLOTTING TIME: Activated Clotting Time: 360 s

## 2023-10-06 SURGERY — ATRIAL FIBRILLATION ABLATION
Anesthesia: General

## 2023-10-06 MED ORDER — SODIUM CHLORIDE 0.9 % IV SOLN: 250.0000 mL | INTRAVENOUS | Status: DC | PRN

## 2023-10-06 MED ORDER — HEPARIN SODIUM (PORCINE) 1000 UNIT/ML IJ SOLN
INTRAMUSCULAR | Status: DC | PRN
  Administered 2023-10-06: 15000 [IU] via INTRAVENOUS

## 2023-10-06 MED ORDER — FENTANYL CITRATE (PF) 250 MCG/5ML IJ SOLN
INTRAMUSCULAR | Status: DC | PRN
  Administered 2023-10-06 (×2): 50 ug via INTRAVENOUS

## 2023-10-06 MED ORDER — SODIUM CHLORIDE 0.9% FLUSH: 3.0000 mL | INTRAVENOUS | Status: DC | PRN

## 2023-10-06 MED ORDER — DEXAMETHASONE SODIUM PHOSPHATE 10 MG/ML IJ SOLN
INTRAMUSCULAR | Status: DC | PRN
  Administered 2023-10-06: 10 mg via INTRAVENOUS

## 2023-10-06 MED ORDER — PHENYLEPHRINE HCL-NACL 20-0.9 MG/250ML-% IV SOLN
INTRAVENOUS | Status: DC | PRN
  Administered 2023-10-06: 25 ug/min via INTRAVENOUS

## 2023-10-06 MED ORDER — ROCURONIUM BROMIDE 10 MG/ML (PF) SYRINGE
PREFILLED_SYRINGE | INTRAVENOUS | Status: DC | PRN
  Administered 2023-10-06: 80 mg via INTRAVENOUS

## 2023-10-06 MED ORDER — PROPOFOL 10 MG/ML IV BOLUS
INTRAVENOUS | Status: DC | PRN
  Administered 2023-10-06: 150 mg via INTRAVENOUS

## 2023-10-06 MED ORDER — ATROPINE SULFATE 1 MG/ML IV SOLN
INTRAVENOUS | Status: DC | PRN
  Administered 2023-10-06: 1 mg via INTRAVENOUS

## 2023-10-06 MED ORDER — SCOPOLAMINE 1 MG/3DAYS TD PT72
1.0000 | MEDICATED_PATCH | TRANSDERMAL | Status: DC
  Administered 2023-10-06: 1.5 mg via TRANSDERMAL
  Filled 2023-10-06: qty 1

## 2023-10-06 MED ORDER — SODIUM CHLORIDE 0.9 % IV SOLN
INTRAVENOUS | Status: DC
Start: 2023-10-06 — End: 2023-10-06

## 2023-10-06 MED ORDER — SODIUM CHLORIDE 0.9% FLUSH: 3.0000 mL | Freq: Two times a day (BID) | INTRAVENOUS | Status: DC

## 2023-10-06 MED ORDER — ONDANSETRON HCL 4 MG/2ML IJ SOLN
4.0000 mg | Freq: Four times a day (QID) | INTRAMUSCULAR | Status: DC | PRN
Start: 2023-10-06 — End: 2023-10-06

## 2023-10-06 MED ORDER — HEPARIN (PORCINE) IN NACL 1000-0.9 UT/500ML-% IV SOLN
INTRAVENOUS | Status: DC | PRN
  Administered 2023-10-06 (×3): 500 mL

## 2023-10-06 MED ORDER — ONDANSETRON HCL 4 MG/2ML IJ SOLN
INTRAMUSCULAR | Status: DC | PRN
  Administered 2023-10-06: 4 mg via INTRAVENOUS

## 2023-10-06 MED ORDER — APREPITANT 40 MG PO CAPS
40.0000 mg | ORAL_CAPSULE | Freq: Once | ORAL | Status: AC
  Administered 2023-10-06: 40 mg via ORAL
  Filled 2023-10-06: qty 1

## 2023-10-06 MED ORDER — ACETAMINOPHEN 325 MG PO TABS: 650.0000 mg | ORAL_TABLET | ORAL | Status: DC | PRN

## 2023-10-06 MED ORDER — PROTAMINE SULFATE 10 MG/ML IV SOLN
INTRAVENOUS | Status: DC | PRN
  Administered 2023-10-06: 40 mg via INTRAVENOUS

## 2023-10-06 MED ORDER — SUGAMMADEX SODIUM 200 MG/2ML IV SOLN
INTRAVENOUS | Status: DC | PRN
  Administered 2023-10-06: 225 mg via INTRAVENOUS

## 2023-10-06 MED ORDER — FENTANYL CITRATE (PF) 100 MCG/2ML IJ SOLN
INTRAMUSCULAR | Status: AC
  Filled 2023-10-06: qty 2

## 2023-10-06 SURGICAL SUPPLY — 21 items
BAG SNAP BAND KOVER 36X36 (MISCELLANEOUS) IMPLANT
CABLE PFA RX CATH CONN (CABLE) IMPLANT
CATH EZ STEER NAV 8MM D-F CUR (ABLATOR) IMPLANT
CATH FARAWAVE ABLATION 31 (CATHETERS) IMPLANT
CATH GE 8FR SOUNDSTAR (CATHETERS) IMPLANT
CATH OCTARAY 2.0 F 3-3-3-3-3 (CATHETERS) IMPLANT
CATH WEB BI DIR CSDF CRV REPRO (CATHETERS) IMPLANT
CLOSURE MYNX CONTROL 6F/7F (Vascular Products) IMPLANT
CLOSURE PERCLOSE PROSTYLE (VASCULAR PRODUCTS) IMPLANT
COVER SWIFTLINK CONNECTOR (BAG) ×2 IMPLANT
DILATOR VESSEL 38 20CM 16FR (INTRODUCER) IMPLANT
GUIDEWIRE INQWIRE 1.5J.035X260 (WIRE) IMPLANT
KIT VERSACROSS CNCT FARADRIVE (KITS) IMPLANT
MAT PREVALON FULL STRYKER (MISCELLANEOUS) IMPLANT
PACK EP LF (CUSTOM PROCEDURE TRAY) ×2 IMPLANT
PAD DEFIB RADIO PHYSIO CONN (PAD) ×2 IMPLANT
PATCH CARTO3 (PAD) IMPLANT
SHEATH FARADRIVE STEERABLE (SHEATH) IMPLANT
SHEATH PINNACLE 8F 10CM (SHEATH) IMPLANT
SHEATH PINNACLE 9F 10CM (SHEATH) IMPLANT
SHEATH PROBE COVER 6X72 (BAG) IMPLANT

## 2023-10-06 NOTE — Progress Notes (Signed)
 Patient walked to the bathroom without difficulties. Bilateral groins level 0, clean, dry, and intact.

## 2023-10-06 NOTE — Anesthesia Procedure Notes (Addendum)
 Procedure Name: Intubation Date/Time: 10/06/2023 10:02 AM  Performed by: Jamas Maywood, CRNAPre-anesthesia Checklist: Patient identified, Emergency Drugs available, Suction available and Patient being monitored Patient Re-evaluated:Patient Re-evaluated prior to induction Oxygen Delivery Method: Circle system utilized Preoxygenation: Pre-oxygenation with 100% oxygen Induction Type: IV induction Ventilation: Mask ventilation without difficulty and Oral airway inserted - appropriate to patient size Laryngoscope Size: Mac and 4 Grade View: Grade II Tube type: Oral Tube size: 7.0 mm Number of attempts: 1 Airway Equipment and Method: Stylet and Oral airway Placement Confirmation: ETT inserted through vocal cords under direct vision, positive ETCO2 and breath sounds checked- equal and bilateral Secured at: 22 cm Tube secured with: Tape Dental Injury: Teeth and Oropharynx as per pre-operative assessment

## 2023-10-06 NOTE — Discharge Instructions (Signed)

## 2023-10-06 NOTE — H&P (Signed)
  Electrophysiology Office Note:   Date:  10/06/2023  ID:  Michelle Perkins, DOB Oct 10, 1960, MRN [id number]  Primary Cardiologist: Ralene Burger, MD Electrophysiologist: Lei Pump, MD      History of Present Illness:   Michelle Perkins is a 63 y.o. female with h/o atrial fibrillation seen today for routine electrophysiology followup.   Today, denies symptoms of palpitations, chest pain, shortness of breath, orthopnea, PND, lower extremity edema, claudication, dizziness, presyncope, syncope, bleeding, or neurologic sequela. The patient is tolerating medications without difficulties. Plan for AF/flutter ablation today.    EP Information / Studies Reviewed:    EKG is ordered today. Personal review as below.        Risk Assessment/Calculations:    CHA2DS2-VASc Score = 1   This indicates a 0.6% annual risk of stroke. The patient's score is based upon: CHF History: 0 HTN History: 0 Diabetes History: 0 Stroke History: 0 Vascular Disease History: 0 Age Score: 0 Gender Score: 1             Physical Exam:   VS:  BP (!) 153/80   Pulse 96   Temp 98.7 F (37.1 C) (Oral)   Resp 18   Ht 5\' 8"  (1.727 m)   Wt 106.6 kg   SpO2 96%   BMI 35.73 kg/m    Wt Readings from Last 3 Encounters:  10/06/23 106.6 kg  05/17/23 108.4 kg  02/22/23 109.1 kg     GEN: No acute distress.   Neck: No JVD Cardiac: RRR, no murmurs, rubs, or gallops.  Respiratory: normal BS bilaterally. GI: Soft, nontender, non-distended  MS: No edema; No deformity. Neuro:  Nonfocal  Skin: warm and dry Psych: Normal affect    ASSESSMENT AND PLAN:    1.  Persistent atrial fibrillation/flutter: Michelle Perkins has presented today for surgery, with the diagnosis of AF.  The various methods of treatment have been discussed with the patient and family. After consideration of risks, benefits and other options for treatment, the patient has consented to  Procedure(s): Catheter ablation as a surgical intervention  .  Risks include but not limited to complete heart block, stroke, esophageal damage, nerve damage, bleeding, vascular damage, tamponade, perforation, MI, and death. The patient's history has been reviewed, patient examined, no change in status, stable for surgery.  I have reviewed the patient's chart and labs.  Questions were answered to the patient's satisfaction.    Michelle Perkins Michelle Pray, MD 10/06/2023 9:09 AM

## 2023-10-06 NOTE — Anesthesia Postprocedure Evaluation (Signed)
 Anesthesia Post Note  Patient: Michelle Perkins  Procedure(s) Performed: ATRIAL FIBRILLATION ABLATION A-FLUTTER ABLATION     Patient location during evaluation: PACU Anesthesia Type: General Level of consciousness: awake and alert Pain management: pain level controlled Vital Signs Assessment: post-procedure vital signs reviewed and stable Respiratory status: spontaneous breathing, nonlabored ventilation, respiratory function stable and patient connected to nasal cannula oxygen Cardiovascular status: blood pressure returned to baseline and stable Postop Assessment: no apparent nausea or vomiting Anesthetic complications: no   There were no known notable events for this encounter.  Last Vitals:  Vitals:   10/06/23 1330 10/06/23 1400  BP: 137/74 138/75  Pulse: 100 100  Resp: (!) 23 12  Temp:    SpO2: 94% 90%    Last Pain:  Vitals:   10/06/23 1406  TempSrc:   PainSc: 0-No pain                 Leslye Rast

## 2023-10-06 NOTE — Anesthesia Preprocedure Evaluation (Signed)
 Anesthesia Evaluation  Patient identified by MRN, date of birth, ID band Patient awake    Reviewed: Allergy & Precautions, NPO status , Patient's Chart, lab work & pertinent test results, reviewed documented beta blocker date and time   History of Anesthesia Complications Negative for: history of anesthetic complications  Airway Mallampati: II  TM Distance: >3 FB     Dental no notable dental hx.    Pulmonary neg COPD, neg PE   breath sounds clear to auscultation       Cardiovascular hypertension, (-) angina (-) CAD and (-) Past MI + dysrhythmias Atrial Fibrillation  Rhythm:Regular Rate:Normal     Neuro/Psych neg Seizures    GI/Hepatic ,GERD  Medicated and Controlled,,(+) neg Cirrhosis        Endo/Other    Renal/GU Renal disease     Musculoskeletal   Abdominal   Peds  Hematology   Anesthesia Other Findings   Reproductive/Obstetrics                             Anesthesia Physical Anesthesia Plan  ASA: 2  Anesthesia Plan: General   Post-op Pain Management:    Induction: Intravenous  PONV Risk Score and Plan: 2 and Ondansetron and Dexamethasone  Airway Management Planned: Oral ETT  Additional Equipment:   Intra-op Plan:   Post-operative Plan: Extubation in OR  Informed Consent: I have reviewed the patients History and Physical, chart, labs and discussed the procedure including the risks, benefits and alternatives for the proposed anesthesia with the patient or authorized representative who has indicated his/her understanding and acceptance.     Dental advisory given  Plan Discussed with: CRNA  Anesthesia Plan Comments:        Anesthesia Quick Evaluation

## 2023-10-06 NOTE — Transfer of Care (Signed)
 Immediate Anesthesia Transfer of Care Note  Patient: Michelle Perkins  Procedure(s) Performed: ATRIAL FIBRILLATION ABLATION A-FLUTTER ABLATION  Patient Location: PACU  Anesthesia Type:General  Level of Consciousness: awake, drowsy, and patient cooperative  Airway & Oxygen Therapy: Patient Spontanous Breathing and Patient connected to nasal cannula oxygen  Post-op Assessment: Report given to RN, Post -op Vital signs reviewed and stable, and Patient moving all extremities X 4  Post vital signs: Reviewed and stable  Last Vitals:  Vitals Value Taken Time  BP 145/75   Temp    Pulse 98   Resp 14   SpO2 94     Last Pain:  Vitals:   10/06/23 0840  TempSrc:   PainSc: 0-No pain         Complications: There were no known notable events for this encounter.

## 2023-10-07 ENCOUNTER — Telehealth (HOSPITAL_COMMUNITY): Payer: Self-pay

## 2023-10-07 ENCOUNTER — Encounter (HOSPITAL_COMMUNITY): Payer: Self-pay | Admitting: Cardiology

## 2023-10-07 MED FILL — Fentanyl Citrate Preservative Free (PF) Inj 100 MCG/2ML: INTRAMUSCULAR | Qty: 2 | Status: AC

## 2023-10-07 NOTE — Telephone Encounter (Signed)
 Spoke with patient to complete post procedure follow up call.  Patient reports no complications with groin sites.   Instructions reviewed with patient:  Remove large bandage at puncture site after 24 hours. It is normal to have bruising, tenderness and a pea or marble sized lump/knot at the groin site which can take up to three months to resolve.  Get help right away if you notice sudden swelling at the puncture site.  Check your puncture site every day for signs of infection: fever, redness, swelling, pus drainage, warmth, foul odor or excessive pain. If this occurs, please call the office at [phone], to speak with the nurse. Get help right away if your puncture site is bleeding and the bleeding does not stop after applying firm pressure to the area.  You may continue to have skipped beats/ atrial fibrillation during the first several months after your procedure.  It is very important not to miss any doses of your blood thinner Eliquis . Patient restarted taking this medication.     You will follow up with the Afib clinic on 11/03/23 and follow up with the APP on 01/06/24. Pt inquired if her appointments could be in [place]. Advised will have scheduling contact her to discuss and/or reschedule, if possible.   Patient verbalized understanding to all instructions provided.

## 2023-10-10 NOTE — Progress Notes (Unsigned)
 Cardiology Office Note:  .   Date:  10/12/2023  ID:  Michelle Perkins, DOB Oct 30, 1960, MRN [id number] PCP: Olan Bering, MD  [facility] Providers Cardiologist:  Ralene Burger, MD Electrophysiologist:  Lei Pump, MD    History of Present Illness: .   Michelle Perkins is a 63 y.o. female with a past medical history of atrial fibrillation, hypertension, history of anemia, palpitations.   10/06/2023 atrial fibrillation ablation 10/05/2023 cardiac CT calcium score of 0 12/16/2022 echo EF 55 to 60%, LA mild to moderately dilated, moderate MR, mild aortic sclerosis without stenosis  She established care in 2019 with Dr. Krasowski for evaluation management of her paroxysmal atrial fibrillation.  She required a few cardioversions, had difficulty tolerating antiarrhythmics and ultimately decided she would like to have an ablation.  She was most recently evaluated by Dr. Lawana Pray on 05/17/2023, maintaining sinus rhythm with would have episodes of atrial flutter, also bothered by fatigue.  She underwent atrial fibrillation ablation on 10/06/2023.  She presents today for follow-up after recent atrial fibrillation ablation, she is maintaining sinus rhythm, she is feeling well overall.  She has noticed palpitations here and there but nothing worth noticing, she works as a Engineer, civil (consulting) at [employer] and is well versed in her care, plans to return to work next Friday.  Ablation site is clean, dry and intact. She denies chest pain, palpitations, dyspnea, pnd, orthopnea, n, v, dizziness, syncope, edema, weight gain, or early satiety.    ROS: Review of Systems  All other systems reviewed and are negative.    Studies Reviewed: Aaron Aas   EKG Interpretation Date/Time:  Tuesday Oct 12 2023 15:17:33 EDT Ventricular Rate:  67 PR Interval:  164 QRS Duration:  72 QT Interval:  402 QTC Calculation: 424 R Axis:   21  Text Interpretation: Normal sinus rhythm Low voltage QRS When compared with ECG of  06-Oct-2023 11:53, Nonspecific T wave abnormality no longer evident in Inferior leads Confirmed by Pattricia Bores [phone]) on 10/12/2023 3:20:48 PM    Cardiac Studies & Procedures   ______________________________________________________________________________________________        Carles Cheadle  LONG TERM MONITOR (3-14 DAYS) 01/04/2019  Narrative Michelle Perkins, DOB [birth year], MRN [id number]  HOLTER MONITOR REPORT:    Date of test:                 12/20/2018 Duration of test:           6 days Indication:                    Paroxysmal atrial fibrillation Ordering physician:  Manfred Seed, MD Referring physician:  Manfred Seed, MD   Baseline rhythm: Sinus  Minimum heart rate: 49 BPM.  Average heart rate: 68 BPM.  Maximal heart rate 167 BPM.  Atrial arrhythmia: Premature supraventricular beats noted as well as narrow complex tachycardias.  Usually there is a regular.  I do not see clear-cut evidence of atrial fibrillation.  Longest episode 11 beats at rate of 95 fastest episodes 8 beats at rate of 167  Ventricular arrhythmia: Infrequent PVCs, no sustained arrhythmia  Conduction abnormality: None  Symptoms: None   Conclusion: No clear-cut evidence of atrial fibrillation. Supraventricular tachycardia.  Interpreting  cardiologist: Ralene Burger, MD Date: 01/15/2019 9:39 AM       ______________________________________________________________________________________________      Risk Assessment/Calculations:    CHA2DS2-VASc Score = 1   This indicates a 0.6% annual risk of stroke. The patient's score is  based upon: CHF History: 0 HTN History: 0 Diabetes History: 0 Stroke History: 0 Vascular Disease History: 0 Age Score: 0 Gender Score: 1             Physical Exam:   VS:  BP 130/70   Pulse 67   Ht 5\' 8"  (1.727 m)   Wt 234 lb (106.1 kg)   SpO2 97%   BMI 35.58 kg/m    Wt Readings from Last 3 Encounters:  10/12/23 234 lb (106.1 kg)  10/06/23 235 lb  (106.6 kg)  05/17/23 239 lb (108.4 kg)    GEN: Well nourished, well developed in no acute distress NECK: No JVD; No carotid bruits CARDIAC: RRR, no murmurs, rubs, gallops RESPIRATORY:  Clear to auscultation without rales, wheezing or rhonchi  ABDOMEN: Soft, non-tender, non-distended EXTREMITIES:  No edema; No deformity  SKIN: catheter site healing well, no palpable hematoma  ASSESSMENT AND PLAN: .   Paroxysmal atrial fibrillation/hypercoagulable state-s/p ablation on 10/06/2023, she is maintaining sinus rhythm.  She will follow-up with atrial fibrillation clinic on 11/03/2023.  Continue Eliquis  5 mg twice daily, continue metoprolol  50 mg once daily, continue diltiazem  180 mg once daily.  Hypertension-her blood pressure today was controlled at 130/70, continue diltiazem  180 mg once daily.         Dispo: Follow-up to be determined after she sees atrial fibrillation clinic following her ablation.  Signed, Terrance Ferretti, NP

## 2023-10-11 NOTE — Telephone Encounter (Signed)
 Forms completed/signed.  Left with front desk in [place].

## 2023-10-12 ENCOUNTER — Ambulatory Visit: Attending: Cardiology | Admitting: Cardiology

## 2023-10-12 ENCOUNTER — Encounter: Payer: Self-pay | Admitting: Cardiology

## 2023-10-12 VITALS — BP 130/70 | HR 67 | Ht 68.0 in | Wt 234.0 lb

## 2023-10-12 DIAGNOSIS — D6859 Other primary thrombophilia: Secondary | ICD-10-CM

## 2023-10-12 DIAGNOSIS — I4819 Other persistent atrial fibrillation: Secondary | ICD-10-CM

## 2023-10-12 DIAGNOSIS — I10 Essential (primary) hypertension: Secondary | ICD-10-CM

## 2023-10-12 NOTE — Patient Instructions (Signed)
 Medication Instructions:  Your physician recommends that you continue on your current medications as directed. Please refer to the Current Medication list given to you today.  *If you need a refill on your cardiac medications before your next appointment, please call your pharmacy*  Lab Work: None If you have labs (blood work) drawn today and your tests are completely normal, you will receive your results only by: MyChart Message (if you have MyChart) OR A paper copy in the mail If you have any lab test that is abnormal or we need to change your treatment, we will call you to review the results.  Testing/Procedures: None  Follow-Up: At [facility], you and your health needs are our priority.  As part of our continuing mission to provide you with exceptional heart care, our providers are all part of one team.  This team includes your primary Cardiologist (physician) and Advanced Practice Providers or APPs (Physician Assistants and Nurse Practitioners) who all work together to provide you with the care you need, when you need it.  Your next appointment:   Keep Appointment with A-fib clinic  Provider:   Keep Appointment with A-fib clinic    We recommend signing up for the patient portal called "MyChart".  Sign up information is provided on this After Visit Summary.  MyChart is used to connect with patients for Virtual Visits (Telemedicine).  Patients are able to view lab/test results, encounter notes, upcoming appointments, etc.  Non-urgent messages can be sent to your provider as well.   To learn more about what you can do with MyChart, go to [email or web address].   Other Instructions None

## 2023-10-12 NOTE — Telephone Encounter (Signed)
 Completed forms received, faxed and scanned in to chart/kbl 10/12/23

## 2023-10-19 NOTE — Telephone Encounter (Signed)
 Payment taken into epic/ipay. Process completed./kbl 10/19/23

## 2023-11-03 ENCOUNTER — Ambulatory Visit (HOSPITAL_COMMUNITY): Admission: RE | Admit: 2023-11-03 | Source: Ambulatory Visit | Admitting: Internal Medicine

## 2023-11-03 ENCOUNTER — Other Ambulatory Visit: Payer: Self-pay | Admitting: Cardiology

## 2023-11-10 ENCOUNTER — Ambulatory Visit: Admitting: Cardiology

## 2023-11-12 ENCOUNTER — Ambulatory Visit (HOSPITAL_COMMUNITY)
Admission: RE | Admit: 2023-11-12 | Discharge: 2023-11-12 | Disposition: A | Discharge status: Home / Self Care | Source: Ambulatory Visit | Attending: Internal Medicine | Admitting: Internal Medicine

## 2023-11-12 VITALS — BP 138/70 | HR 71 | Ht 68.0 in | Wt 236.0 lb

## 2023-11-12 DIAGNOSIS — I4819 Other persistent atrial fibrillation: Secondary | ICD-10-CM

## 2023-11-12 DIAGNOSIS — I4891 Unspecified atrial fibrillation: Secondary | ICD-10-CM | POA: Diagnosis not present

## 2023-11-12 NOTE — Progress Notes (Signed)
    Primary Care Physician: Olan Bering, MD Primary Cardiologist: Ralene Burger, MD Electrophysiologist: Will Cortland Ding, MD     Referring Physician: Dr. Lolly Riser is a 63 y.o. female with a history of HTN, anemia, atrial fibrillation who presents for consultation in the [facility]. Patient is on Eliquis  5 mg BID for a CHADS2VASC score of 2.  On evaluation today, patient is currently in NSR. S/p Afib/flutter ablation on 10/06/23 by Dr. Lawana Pray. No episodes of Afib since ablation. No chest pain or SOB. Leg sites healed without issue. No missed doses of Eliquis  5 mg BID.  Today, she denies symptoms of orthopnea, PND, lower extremity edema, dizziness, presyncope, syncope, snoring, daytime somnolence, bleeding, or neurologic sequela. The patient is tolerating medications without difficulties and is otherwise without complaint today.    she has a BMI of Body mass index is 35.88 kg/m.Aaron Aas Filed Weights   11/12/23 0935  Weight: 107 kg    Current Outpatient Medications  Medication Sig Dispense Refill   diltiazem  (CARDIZEM  CD) 180 MG 24 hr capsule Take 1 capsule (180 mg total) by mouth at bedtime. 90 capsule 0   diltiazem  (CARDIZEM ) 30 MG tablet Take 1 tablet (30 mg total) by mouth as needed (tachycardia). If heart rate is over 120 90 tablet 2   ELIQUIS  5 MG TABS tablet TAKE 1 TABLET BY MOUTH TWICE  DAILY 180 tablet 3   metoprolol  succinate (TOPROL -XL) 50 MG 24 hr tablet TAKE 1 TABLET BY MOUTH AT  BEDTIME 90 tablet 3   pantoprazole  (PROTONIX ) 40 MG tablet TAKE 1 TABLET BY MOUTH DAILY 90 tablet 3   No current facility-administered medications for this encounter.    Atrial Fibrillation Management history:  Previous antiarrhythmic drugs: propafenone  Previous cardioversions: none Previous ablations: 10/06/23 Anticoagulation history: Eliquis    ROS- All systems are reviewed and negative except as per the HPI above.  Physical  Exam: Ht 5\' 8"  (1.727 m)   Wt 107 kg   BMI 35.88 kg/m   GEN: Well nourished, well developed in no acute distress NECK: No JVD; No carotid bruits CARDIAC: Regular rate and rhythm, no murmurs, rubs, gallops RESPIRATORY:  Clear to auscultation without rales, wheezing or rhonchi  ABDOMEN: Soft, non-tender, non-distended EXTREMITIES:  No edema; No deformity   EKG today demonstrates  Vent. rate 71 BPM PR interval 170 ms QRS duration 74 ms QT/QTcB 386/419 ms P-R-T axes 65 57 60 Normal sinus rhythm Normal ECG When compared with ECG of 12-Oct-2023 15:17, No significant change was found  Echo 12/16/2022 Theodis Fiscal study) demonstrated normal LV function  ASSESSMENT & PLAN CHA2DS2-VASc Score = 2  The patient's score is based upon: CHF History: 0 HTN History: 1 Diabetes History: 0 Stroke History: 0 Vascular Disease History: 0 Age Score: 0 Gender Score: 1       ASSESSMENT AND PLAN: Persistent Atrial Fibrillation (ICD10:  I48.19) / atrial flutter The patient's CHA2DS2-VASc score is 2, indicating a 2.2% annual risk of stroke.   S/p Afib/flutter ablation on 10/06/23 by Dr. Lawana Pray.   She is currently in NSR. Continue Eliquis  5 mg BID without interruption. She notes she may wish to remain on Eliquis  after the blanking period due to strong family history of stroke.    Follow up with EP as scheduled.   Minnie Amber, PA-C  Afib Clinic [facility] [address] [place], [place] [address] [phone]

## 2023-12-15 ENCOUNTER — Other Ambulatory Visit: Payer: Self-pay | Admitting: Cardiology

## 2024-01-06 ENCOUNTER — Ambulatory Visit: Admitting: Student

## 2024-01-07 ENCOUNTER — Ambulatory Visit: Admitting: Student

## 2024-01-26 NOTE — Progress Notes (Unsigned)
  Electrophysiology Office Note:   Date:  01/28/2024  ID:  Michelle Perkins, DOB Jan 03, 1961, MRN [id number]  Primary Cardiologist: Lamar Fitch, MD Electrophysiologist: Will Gladis Norton, MD   Electrophysiologist:  Soyla Gladis Norton, MD      History of Present Illness:   Michelle Perkins is a 63 y.o. female with h/o HTN, anemia, and PAF seen today for routine electrophysiology follow-up s/p Ablation.  Since last being seen in our clinic the patient reports doing very well. Brief palpitations a couple of times a week, but nothing sustained; Only 2-4 beats at a time. Otherwise, she denies chest pain, dyspnea, PND, orthopnea, nausea, vomiting, dizziness, syncope, edema, weight gain, or early satiety.    Review of systems complete and found to be negative unless listed in HPI.   EP Information / Studies Reviewed:    EKG is ordered today. Personal review as below.  EKG Interpretation Date/Time:  Friday January 28 2024 09:30:08 EDT Ventricular Rate:  63 PR Interval:  182 QRS Duration:  78 QT Interval:  410 QTC Calculation: 419 R Axis:   26  Text Interpretation: Normal sinus rhythm Nonspecific ST abnormality When compared with ECG of 12-Nov-2023 09:41, No significant change was found Confirmed by Lesia Sharper [phone]) on 01/28/2024 9:42:39 AM    Arrhythmia/Device History No specialty comments available.   Physical Exam:   VS:  BP (!) 144/78   Pulse 63   Ht 5' 8 (1.727 m)   Wt 234 lb (106.1 kg)   SpO2 97%   BMI 35.58 kg/m    Wt Readings from Last 3 Encounters:  01/28/24 234 lb (106.1 kg)  11/12/23 236 lb (107 kg)  10/12/23 234 lb (106.1 kg)     GEN: No acute distress NECK: No JVD; No carotid bruits CARDIAC: Regular rate and rhythm, no murmurs, rubs, gallops RESPIRATORY:  Clear to auscultation without rales, wheezing or rhonchi  ABDOMEN: Soft, non-tender, non-distended EXTREMITIES:  No edema; No deformity   ASSESSMENT AND PLAN:    Persistent AF EKG today shows  NSR CHA2DS2/VASc is 2 (HTN and female) ; With strong family history of strokes, she would prefer to remain on Eliquis  Continue Eliquis  5 mg BID Continue diltiazem  180 mg daily Refill diltiazem  prn Continue toprol  50 mg at bedtime  Offered to decrease meds, she is feeling well and would prefer not to rock the boat.   HTN Stable on current regimen    Follow up with Dr. Norton in 12 months. If remains stable and Dr. MARLA willing to take over other meds, could potentially see EP prn.   Signed, Sharper Prentice Lesia, PA-C

## 2024-01-28 ENCOUNTER — Encounter: Payer: Self-pay | Admitting: Student

## 2024-01-28 ENCOUNTER — Ambulatory Visit: Attending: Student | Admitting: Student

## 2024-01-28 VITALS — BP 144/78 | HR 63 | Ht 68.0 in | Wt 234.0 lb

## 2024-01-28 DIAGNOSIS — I10 Essential (primary) hypertension: Secondary | ICD-10-CM

## 2024-01-28 DIAGNOSIS — I480 Paroxysmal atrial fibrillation: Secondary | ICD-10-CM

## 2024-01-28 DIAGNOSIS — I4819 Other persistent atrial fibrillation: Secondary | ICD-10-CM

## 2024-01-28 DIAGNOSIS — I4891 Unspecified atrial fibrillation: Secondary | ICD-10-CM

## 2024-01-28 MED ORDER — DILTIAZEM HCL 30 MG PO TABS: ORAL_TABLET | ORAL | 2 refills | Status: AC

## 2024-01-28 NOTE — Patient Instructions (Signed)
 Medication Instructions:  Your physician recommends that you continue on your current medications as directed. Please refer to the Current Medication list given to you today.  *If you need a refill on your cardiac medications before your next appointment, please call your pharmacy*  Lab Work: None ordered If you have labs (blood work) drawn today and your tests are completely normal, you will receive your results only by: MyChart Message (if you have MyChart) OR A paper copy in the mail If you have any lab test that is abnormal or we need to change your treatment, we will call you to review the results.  Follow-Up: At [facility], you and your health needs are our priority.  As part of our continuing mission to provide you with exceptional heart care, our providers are all part of one team.  This team includes your primary Cardiologist (physician) and Advanced Practice Providers or APPs (Physician Assistants and Nurse Practitioners) who all work together to provide you with the care you need, when you need it.  Your next appointment:   1 year(s)  Provider:   Agatha Horsfall, MD

## 2024-06-20 ENCOUNTER — Ambulatory Visit

## 2024-06-20 ENCOUNTER — Telehealth: Payer: Self-pay | Admitting: Cardiology

## 2024-06-20 VITALS — BP 154/70 | HR 72 | Ht 68.0 in | Wt 233.0 lb

## 2024-06-20 DIAGNOSIS — I4891 Unspecified atrial fibrillation: Secondary | ICD-10-CM | POA: Diagnosis not present

## 2024-06-20 NOTE — Telephone Encounter (Signed)
 Patient c/o Palpitations:  STAT if patient reporting lightheadedness, shortness of breath, or chest pain  How long have you had palpitations/irregular HR/ Afib? Are you having the symptoms now?   Yes  Are you currently experiencing lightheadedness, SOB or CP?   No  Do you have a history of afib (atrial fibrillation) or irregular heart rhythm?   yes  Have you checked your BP or HR? (document readings if available):   HR 81 - this morning, 8:50 am  Are you experiencing any other symptoms?  No  Patient stated she had an ablation about 9 months ago and last night around midnight she went into Afib.  Patient stated her Cardia device has been showing her HR has been ranging in the 80's.

## 2024-06-20 NOTE — Progress Notes (Signed)
" ° °  Nurse Visit   Date of Encounter: 06/20/2024 ID: Michelle Perkins, DOB [birth year], MRN [id number]  PCP:  Jefferey Fitch, MD   [facility] Providers Cardiologist:  Lamar Fitch, MD Electrophysiologist:  Soyla Gladis Norton, MD      Visit Details   VS:  There were no vitals taken for this visit. , BMI There is no height or weight on file to calculate BMI.  Wt Readings from Last 3 Encounters:  01/28/24 234 lb (106.1 kg)  11/12/23 236 lb (107 kg)  10/12/23 234 lb (106.1 kg)     Reason for visit: EKG for irregular HR on Kardia Performed today: Vitals, EKG, Consulted with Dr. Fitch Changes (medications, testing, etc.) : No changes- follow up in Spring 2026 Length of Visit: 20 minutes    Medications Adjustments/Labs and Tests Ordered: Orders Placed This Encounter  Procedures   EKG 12-Lead   No orders of the defined types were placed in this encounter.    Signed, Olam JONETTA Lesches, RN  06/20/2024 3:18 PM  "

## 2024-06-20 NOTE — Telephone Encounter (Signed)
 Spoke with pt advised per Dr. Krasowski to come in for EKG to see if she is in A fib. Pt agreed. Sent to front desk to make nurse visit appt.

## 2024-07-11 ENCOUNTER — Other Ambulatory Visit: Payer: Self-pay | Admitting: Gastroenterology

## 2024-07-13 ENCOUNTER — Encounter: Payer: Self-pay | Admitting: Cardiology

## 2024-07-13 ENCOUNTER — Ambulatory Visit: Admitting: Cardiology

## 2024-07-13 VITALS — BP 120/80 | HR 97 | Ht 68.0 in | Wt 229.0 lb

## 2024-07-13 DIAGNOSIS — I4819 Other persistent atrial fibrillation: Secondary | ICD-10-CM

## 2024-07-13 DIAGNOSIS — I10 Essential (primary) hypertension: Secondary | ICD-10-CM

## 2024-07-13 MED ORDER — FLECAINIDE ACETATE 50 MG PO TABS: 50.0000 mg | ORAL_TABLET | Freq: Two times a day (BID) | ORAL | 3 refills | Status: AC

## 2024-07-13 MED ORDER — DILTIAZEM HCL ER COATED BEADS 240 MG PO CP24: 240.0000 mg | ORAL_CAPSULE | Freq: Every day | ORAL | 3 refills | Status: AC

## 2024-07-13 NOTE — Progress Notes (Unsigned)
 " Cardiology Office Note:    Date:  07/13/2024   ID:  Michelle Perkins, DOB 08/17/1960, MRN [id number]  PCP:  Jefferey Fitch, MD  Cardiologist:  Lamar Fitch, MD    Referring MD: Jefferey Fitch, MD   Chief Complaint  Patient presents with   Atrial Fibrillation    History of Present Illness:     Michelle Perkins is a 64 y.o. female past medical history significant for paroxysmal atrial fibrillation/atrial flutter, status post atrial fibrillation atrial flutter ablation in April 2025.  Previously she was on Rythmol  but could not tolerate the medication well.  Called in 2 days ago that she is back in atrial fibrillation.  Not feeling well definitely favor normal sinus rhythm.  We had a long discussion about what to do with the situation.  She describes a light episode of dizziness but otherwise doing well.  She actually went to atrial fibrillation while working.  Past Medical History:  Diagnosis Date   Dyspnea on exertion 06/06/2018   GERD (gastroesophageal reflux disease)    History of anemia    Palpitations 06/06/2018   Paroxysmal atrial fibrillation (HCC) 06/06/2018   Persistent atrial fibrillation (HCC) 07/24/2019   Snoring 11/07/2019   Vitamin D deficiency     Past Surgical History:  Procedure Laterality Date   A-FLUTTER ABLATION N/A 10/06/2023   Procedure: A-FLUTTER ABLATION;  Surgeon: Inocencio Soyla Lunger, MD;  Location: [facility];  Service: Cardiovascular;  Laterality: N/A;   APPENDECTOMY     ATRIAL FIBRILLATION ABLATION N/A 10/06/2023   Procedure: ATRIAL FIBRILLATION ABLATION;  Surgeon: Inocencio Soyla Lunger, MD;  Location: [facility]VAS[facility] CV [facility];  Service: Cardiovascular;  Laterality: N/A;    Current Medications: Active Medications[1]   Allergies:   Codeine, Levaquin [levofloxacin], Lidocaine, and Penicillins   Social History   Socioeconomic History   Marital status: Married    Spouse name: Not on file   Number of children: 2   Years of education: Not on  file   Highest education level: Not on file  Occupational History   Not on file  Tobacco Use   Smoking status: Never   Smokeless tobacco: Never  Vaping Use   Vaping status: Never Used  Substance and Sexual Activity   Alcohol use: Never   Drug use: Never   Sexual activity: Not on file  Other Topics Concern   Not on file  Social History Narrative   Not on file   Social Drivers of Health   Tobacco Use: Low Risk (07/13/2024)   Patient History    Smoking Tobacco Use: Never    Smokeless Tobacco Use: Never    Passive Exposure: Not on file  Financial Resource Strain: Not on file  Food Insecurity: Not on file  Transportation Needs: Not on file  Physical Activity: Not on file  Stress: Not on file  Social Connections: Not on file  Depression (EYV7-0): Not on file  Alcohol Screen: Not on file  Housing: Not on file  Utilities: Not on file  Health Literacy: Not on file     Family History: The patient's family history includes Atrial fibrillation in her brother, father, mother, and sister; Heart failure in her father; Lung cancer in her mother. There is no history of Colon cancer, Pancreatic cancer, Esophageal cancer, Liver disease, Rectal cancer, or Prostate cancer. ROS:   Please see the history of present illness.    All 14 point review of systems negative except as described per history of present illness  EKGs/Labs/Other Studies Reviewed:    EKG Interpretation Date/Time:  Thursday July 13 2024 08:12:00 EST Ventricular Rate:  97 PR Interval:    QRS Duration:  70 QT Interval:  336 QTC Calculation: 426 R Axis:   54  Text Interpretation: Atrial fibrillation Nonspecific ST abnormality When compared with ECG of 20-Jun-2024 15:14, Atrial fibrillation has replaced Sinus rhythm Confirmed by Bernie Charleston [phone]) on 07/13/2024 8:30:36 AM    Recent Labs: No results found for requested labs within last 365 days.  Recent Lipid Panel No results found for: CHOL, TRIG,  HDL, CHOLHDL, VLDL, LDLCALC, LDLDIRECT  Physical Exam:    VS:  BP 120/80   Pulse 97   Ht 5' 8 (1.727 m)   Wt 229 lb (103.9 kg)   SpO2 97%   BMI 34.82 kg/m     Wt Readings from Last 3 Encounters:  07/13/24 229 lb (103.9 kg)  06/20/24 233 lb (105.7 kg)  01/28/24 234 lb (106.1 kg)     GEN:  Well nourished, well developed in no acute distress HEENT: Normal NECK: No JVD; No carotid bruits LYMPHATICS: No lymphadenopathy CARDIAC: RRR, no murmurs, no rubs, no gallops RESPIRATORY:  Clear to auscultation without rales, wheezing or rhonchi  ABDOMEN: Soft, non-tender, non-distended MUSCULOSKELETAL:  No edema; No deformity  SKIN: Warm and dry LOWER EXTREMITIES: no swelling NEUROLOGIC:  Alert and oriented x 3 PSYCHIATRIC:  Normal affect   ASSESSMENT:    1. Persistent atrial fibrillation (HCC)   2. Essential hypertension    PLAN:    In order of problems listed above:  Paroxysmal atrial fibrillation now after ablation of atrial fibrillation and flutter back in atrial fibrillation we had a long discussion about the situation.  Will send her back to our EP team in the meantime we will initiate flecainide  only 50 mg twice daily, we will bring her back to the office on Monday to see her rhythm.  Will continue anticoagulation.  Also will increase dose of diltiazem  to 240 mg daily..  The begin of next week we will talk about potential cardioversion.  She did have calcium score 0, echocardiogram showed preserved ejection fraction Essential hypertension blood pressure well-controlled continue present management. Overall obviously she is frustrated with the situation and hopefully will be able to control her rhythm better anticipate her to have needs to get generic ablation since she does have difficulty tolerating medications   Medication Adjustments/Labs and Tests Ordered: Current medicines are reviewed at length with the patient today.  Concerns regarding medicines are outlined  above.  Orders Placed This Encounter  Procedures   EKG 12-Lead   Medication changes: No orders of the defined types were placed in this encounter.   Signed, [place] DOROTHA Bernie, MD, [facility] 07/13/2024 8:45 AM    [facility]    [1]  Current Meds  Medication Sig   diltiazem  (CARDIZEM  CD) 180 MG 24 hr capsule TAKE 1 CAPSULE BY MOUTH AT  BEDTIME   diltiazem  (CARDIZEM ) 30 MG tablet Take 1 tablet by mouth every 8 hours as needed If heart rate is over 120   ELIQUIS  5 MG TABS tablet TAKE 1 TABLET BY MOUTH TWICE  DAILY   metoprolol  succinate (TOPROL -XL) 50 MG 24 hr tablet TAKE 1 TABLET BY MOUTH AT  BEDTIME   pantoprazole  (PROTONIX ) 40 MG tablet Take 1 tablet (40 mg total) by mouth daily. Please call [phone] to schedule an office visit for more refills   "

## 2024-07-13 NOTE — Patient Instructions (Addendum)
 Medication Instructions:   START: Flecainide  50mg  twice daily  INCREASE: Cardizem  CD to 240mg  daily   Lab Work: None Ordered If you have labs (blood work) drawn today and your tests are completely normal, you will receive your results only by: MyChart Message (if you have MyChart) OR A paper copy in the mail If you have any lab test that is abnormal or we need to change your treatment, we will call you to review the results.   Testing/Procedures: None Ordered   Follow-Up: At [facility], you and your health needs are our priority.  As part of our continuing mission to provide you with exceptional heart care, we have created designated Provider Care Teams.  These Care Teams include your primary Cardiologist (physician) and Advanced Practice Providers (APPs -  Physician Assistants and Nurse Practitioners) who all work together to provide you with the care you need, when you need it.  We recommend signing up for the patient portal called MyChart.  Sign up information is provided on this After Visit Summary.  MyChart is used to connect with patients for Virtual Visits (Telemedicine).  Patients are able to view lab/test results, encounter notes, upcoming appointments, etc.  Non-urgent messages can be sent to your provider as well.   To learn more about what you can do with MyChart, go to [email or web address].    Your next appointment:   6 week(s)  The format for your next appointment:   In Person  Provider:   Lamar Fitch, MD    Other Instructions Referral to Dr. Inocencio

## 2024-07-18 ENCOUNTER — Ambulatory Visit: Admitting: Cardiology

## 2024-08-24 ENCOUNTER — Ambulatory Visit: Admitting: Cardiology

## 2024-10-02 ENCOUNTER — Ambulatory Visit: Admitting: Cardiology
